# Patient Record
Sex: Female | Born: 1990 | Race: Black or African American | Hispanic: No | Marital: Single | Smoking: Former smoker
Health system: Southern US, Community
[De-identification: ages and names within clinical notes are randomized; demographics above are authoritative.]

## PROBLEM LIST (undated history)

## (undated) ENCOUNTER — Inpatient Hospital Stay (HOSPITAL_COMMUNITY): Payer: Self-pay

## (undated) DIAGNOSIS — N83209 Unspecified ovarian cyst, unspecified side: Secondary | ICD-10-CM

## (undated) DIAGNOSIS — I10 Essential (primary) hypertension: Secondary | ICD-10-CM

## (undated) DIAGNOSIS — Z8719 Personal history of other diseases of the digestive system: Secondary | ICD-10-CM

## (undated) HISTORY — PX: WISDOM TOOTH EXTRACTION: SHX21

## (undated) HISTORY — PX: NO PAST SURGERIES: SHX2092

---
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
## 2017-07-15 LAB — OB RESULTS CONSOLE GC/CHLAMYDIA
CHLAMYDIA, DNA PROBE: NEGATIVE
GC PROBE AMP, GENITAL: NEGATIVE

## 2017-07-15 LAB — OB RESULTS CONSOLE HEPATITIS B SURFACE ANTIGEN: Hepatitis B Surface Ag: NEGATIVE

## 2017-07-15 LAB — OB RESULTS CONSOLE RPR: RPR: NONREACTIVE

## 2017-07-15 LAB — OB RESULTS CONSOLE RUBELLA ANTIBODY, IGM: RUBELLA: IMMUNE

## 2017-07-15 LAB — OB RESULTS CONSOLE HIV ANTIBODY (ROUTINE TESTING): HIV: NONREACTIVE

## 2017-07-16 ENCOUNTER — Other Ambulatory Visit (HOSPITAL_COMMUNITY): Payer: Self-pay | Admitting: Nurse Practitioner

## 2017-07-16 DIAGNOSIS — Z3682 Encounter for antenatal screening for nuchal translucency: Secondary | ICD-10-CM

## 2017-07-16 DIAGNOSIS — Z3A13 13 weeks gestation of pregnancy: Secondary | ICD-10-CM

## 2017-07-24 ENCOUNTER — Encounter (HOSPITAL_COMMUNITY): Payer: Self-pay | Admitting: *Deleted

## 2017-07-25 ENCOUNTER — Encounter (HOSPITAL_COMMUNITY): Payer: Self-pay

## 2017-07-25 ENCOUNTER — Ambulatory Visit (HOSPITAL_COMMUNITY)
Admission: RE | Admit: 2017-07-25 | Discharge: 2017-07-25 | Disposition: A | Discharge status: Home / Self Care | Payer: Medicaid Other | Source: Ambulatory Visit | Attending: Nurse Practitioner | Admitting: Nurse Practitioner

## 2017-07-25 DIAGNOSIS — Z3682 Encounter for antenatal screening for nuchal translucency: Secondary | ICD-10-CM | POA: Insufficient documentation

## 2017-07-25 DIAGNOSIS — Z3A13 13 weeks gestation of pregnancy: Secondary | ICD-10-CM | POA: Insufficient documentation

## 2017-07-25 HISTORY — DX: Unspecified ovarian cyst, unspecified side: N83.209

## 2017-07-25 HISTORY — DX: Personal history of other diseases of the digestive system: Z87.19

## 2017-08-01 ENCOUNTER — Other Ambulatory Visit: Payer: Self-pay

## 2017-08-21 ENCOUNTER — Other Ambulatory Visit: Payer: Self-pay

## 2017-09-24 ENCOUNTER — Encounter (HOSPITAL_COMMUNITY): Payer: Self-pay | Admitting: *Deleted

## 2017-09-24 ENCOUNTER — Inpatient Hospital Stay (HOSPITAL_COMMUNITY)
Admission: AD | Admit: 2017-09-24 | Discharge: 2017-09-24 | Disposition: A | Discharge status: Home / Self Care | Payer: Medicaid Other | Source: Ambulatory Visit | Attending: Obstetrics and Gynecology | Admitting: Obstetrics and Gynecology

## 2017-09-24 DIAGNOSIS — O26899 Other specified pregnancy related conditions, unspecified trimester: Secondary | ICD-10-CM

## 2017-09-24 DIAGNOSIS — Z87898 Personal history of other specified conditions: Secondary | ICD-10-CM | POA: Insufficient documentation

## 2017-09-24 DIAGNOSIS — O26892 Other specified pregnancy related conditions, second trimester: Secondary | ICD-10-CM | POA: Diagnosis not present

## 2017-09-24 DIAGNOSIS — O23592 Infection of other part of genital tract in pregnancy, second trimester: Secondary | ICD-10-CM | POA: Diagnosis not present

## 2017-09-24 DIAGNOSIS — R102 Pelvic and perineal pain: Secondary | ICD-10-CM | POA: Diagnosis not present

## 2017-09-24 DIAGNOSIS — B9689 Other specified bacterial agents as the cause of diseases classified elsewhere: Secondary | ICD-10-CM | POA: Insufficient documentation

## 2017-09-24 DIAGNOSIS — Z3A21 21 weeks gestation of pregnancy: Secondary | ICD-10-CM | POA: Diagnosis not present

## 2017-09-24 DIAGNOSIS — R109 Unspecified abdominal pain: Secondary | ICD-10-CM | POA: Diagnosis not present

## 2017-09-24 DIAGNOSIS — Z8719 Personal history of other diseases of the digestive system: Secondary | ICD-10-CM | POA: Insufficient documentation

## 2017-09-24 DIAGNOSIS — Z87891 Personal history of nicotine dependence: Secondary | ICD-10-CM | POA: Diagnosis not present

## 2017-09-24 DIAGNOSIS — Z3A31 31 weeks gestation of pregnancy: Secondary | ICD-10-CM | POA: Insufficient documentation

## 2017-09-24 DIAGNOSIS — N760 Acute vaginitis: Secondary | ICD-10-CM | POA: Diagnosis not present

## 2017-09-24 LAB — GC/CHLAMYDIA PROBE AMP (~~LOC~~) NOT AT ARMC
Chlamydia: NEGATIVE
Neisseria Gonorrhea: NEGATIVE

## 2017-09-24 LAB — WET PREP, GENITAL
SPERM: NONE SEEN
TRICH WET PREP: NONE SEEN
YEAST WET PREP: NONE SEEN

## 2017-09-24 LAB — URINALYSIS, ROUTINE W REFLEX MICROSCOPIC
Bilirubin Urine: NEGATIVE
GLUCOSE, UA: NEGATIVE mg/dL
Hgb urine dipstick: NEGATIVE
KETONES UR: NEGATIVE mg/dL
Leukocytes, UA: NEGATIVE
NITRITE: NEGATIVE
PROTEIN: NEGATIVE mg/dL
Specific Gravity, Urine: 1.004 — ABNORMAL LOW (ref 1.005–1.030)
pH: 6 (ref 5.0–8.0)

## 2017-09-24 MED ORDER — METRONIDAZOLE 500 MG PO TABS: 500.0000 mg | ORAL_TABLET | Freq: Two times a day (BID) | ORAL | 0 refills | Status: DC

## 2017-09-24 MED ORDER — ACETAMINOPHEN 500 MG PO TABS
1000.0000 mg | ORAL_TABLET | Freq: Four times a day (QID) | ORAL | Status: DC | PRN
Start: 2017-09-24 — End: 2017-09-24
  Administered 2017-09-24: 1000 mg via ORAL
  Filled 2017-09-24: qty 2

## 2017-09-24 NOTE — Discharge Instructions (Signed)
Round Ligament Pain The round ligament is a cord of muscle and tissue that helps to support the uterus. It can become a source of pain during pregnancy if it becomes stretched or twisted as the baby grows. The pain usually begins in the second trimester of pregnancy, and it can come and go until the baby is delivered. It is not a serious problem, and it does not cause harm to the baby. Round ligament pain is usually a short, sharp, and pinching pain, but it can also be a dull, lingering, and aching pain. The pain is felt in the lower side of the abdomen or in the groin. It usually starts deep in the groin and moves up to the outside of the hip area. Pain can occur with:  A sudden change in position.  Rolling over in bed.  Coughing or sneezing.  Physical activity.  Follow these instructions at home: Watch your condition for any changes. Take these steps to help with your pain:  When the pain starts, relax. Then try: ? Sitting down. ? Flexing your knees up to your abdomen. ? Lying on your side with one pillow under your abdomen and another pillow between your legs. ? Sitting in a warm bath for 15-20 minutes or until the pain goes away.  Take over-the-counter and prescription medicines only as told by your health care provider.  Move slowly when you sit and stand.  Avoid long walks if they cause pain.  Stop or lessen your physical activities if they cause pain.  Contact a health care provider if:  Your pain does not go away with treatment.  You feel pain in your back that you did not have before.  Your medicine is not helping. Get help right away if:  You develop a fever or chills.  You develop uterine contractions.  You develop vaginal bleeding.  You develop nausea or vomiting.  You develop diarrhea.  You have pain when you urinate. This information is not intended to replace advice given to you by your health care provider. Make sure you discuss any questions you have  with your health care provider. Document Released: 08/14/2008 Document Revised: 04/12/2016 Document Reviewed: 01/12/2015 Elsevier Interactive Patient Education  2018 [employer]. [employer] of the uterus can occur throughout pregnancy, but they are not always a sign that you are in labor. You may have practice contractions called Braxton Hicks contractions. These false labor contractions are sometimes confused with true labor. What are [place] contractions? Braxton Hicks contractions are tightening movements that occur in the muscles of the uterus before labor. Unlike true labor contractions, these contractions do not result in opening (dilation) and thinning of the cervix. Toward the end of pregnancy (32-34 weeks), Braxton Hicks contractions can happen more often and may become stronger. These contractions are sometimes difficult to tell apart from true labor because they can be very uncomfortable. You should not feel embarrassed if you go to the hospital with false labor. Sometimes, the only way to tell if you are in true labor is for your health care provider to look for changes in the cervix. The health care provider will do a physical exam and may monitor your contractions. If you are not in true labor, the exam should show that your cervix is not dilating and your water has not broken. If there are no prenatal problems or other health problems associated with your pregnancy, it is completely safe for you to be sent home with false  labor. You may continue to have Braxton Hicks contractions until you go into true labor. How can I tell the difference between true labor and false labor?  Differences ? False labor ? Contractions last 30-70 seconds.: Contractions are usually shorter and not as strong as true labor contractions. ? Contractions become very regular.: Contractions are usually irregular. ? Discomfort is usually felt in the top of the uterus, and it  spreads to the lower abdomen and low back.: Contractions are often felt in the front of the lower abdomen and in the groin. ? Contractions do not go away with walking.: Contractions may go away when you walk around or change positions while lying down. ? Contractions usually become more intense and increase in frequency.: Contractions get weaker and are shorter-lasting as time goes on. ? The cervix dilates and gets thinner.: The cervix usually does not dilate or become thin. Follow these instructions at home:  Take over-the-counter and prescription medicines only as told by your health care provider.  Keep up with your usual exercises and follow other instructions from your health care provider.  Eat and drink lightly if you think you are going into labor.  If Braxton Hicks contractions are making you uncomfortable: ? Change your position from lying down or resting to walking, or change from walking to resting. ? Sit and rest in a tub of warm water. ? Drink enough fluid to keep your urine clear or pale yellow. Dehydration may cause these contractions. ? Do slow and deep breathing several times an hour.  Keep all follow-up prenatal visits as told by your health care provider. This is important. Contact a health care provider if:  You have a fever.  You have continuous pain in your abdomen. Get help right away if:  Your contractions become stronger, more regular, and closer together.  You have fluid leaking or gushing from your vagina.  You pass blood-tinged mucus (bloody show).  You have bleeding from your vagina.  You have low back pain that you never had before.  You feel your babys head pushing down and causing pelvic pressure.  Your baby is not moving inside you as much as it used to. Summary  Contractions that occur before labor are called Braxton Hicks contractions, false labor, or practice contractions.  Braxton Hicks contractions are usually shorter, weaker, farther  apart, and less regular than true labor contractions. True labor contractions usually become progressively stronger and regular and they become more frequent.  Manage discomfort from [facility] contractions by changing position, resting in a warm bath, drinking plenty of water, or practicing deep breathing. This information is not intended to replace advice given to you by your health care provider. Make sure you discuss any questions you have with your health care provider. Document Released: 11/05/2005 Document Revised: 09/24/2016 Document Reviewed: 09/24/2016 Elsevier Interactive Patient Education  2017 [employer].

## 2017-09-24 NOTE — MAU Note (Signed)
PT SAYS  ABD CRAMPS  STARTED  AT 10AM-   STOPPED .   RESTART AT 6PM-   FEELS SAME - BUT CONSISTENT .   PNC WITH  HD.   LAST SEX-  2 WEEKS AGO

## 2017-09-24 NOTE — MAU Provider Note (Signed)
History     CSN: [id number]  Arrival date and time: 09/24/17 3825   First Provider Initiated Contact with Patient 09/24/17 0200      Chief Complaint  Patient presents with  . Abdominal Pain   G1 @21 .5 wks here with cramping. Sx started about 8 hrs ago. Also feels sharp pain in lower abdomen at times. No VB or vaginal discharge. No urinary sx. Admits to only 2 bottles of water today.   OB History    Gravida Para Term Preterm AB Living   1             SAB TAB Ectopic Multiple Live Births                  Past Medical History:  Diagnosis Date  . History of stomach ulcers 2011  . Ovarian cyst 2011    Past Surgical History:  Procedure Laterality Date  . NO PAST SURGERIES      History reviewed. No pertinent family history.  Social History   Tobacco Use  . Smoking status: Former Smoker    Packs/day: 0.25    Types: Cigarettes    Last attempt to quit: 06/08/2017    Years since quitting: 0.2  . Smokeless tobacco: Never Used  Substance Use Topics  . Alcohol use: No  . Drug use: No    Allergies: No Known Allergies  Medications Prior to Admission  Medication Sig Dispense Refill Last Dose  . Pediatric Multiple Vit-C-FA (FLINSTONES GUMMIES OMEGA-3 DHA PO) Take by mouth.   09/23/2017 at Unknown time    Review of Systems  Gastrointestinal: Positive for abdominal pain.  Genitourinary: Negative for dysuria, vaginal bleeding and vaginal discharge.   Physical Exam   Blood pressure 104/62, pulse 73, temperature 98.2 F (36.8 C), temperature source Oral, resp. rate 20, height 5\' 2"  (1.575 m), weight 163 lb (73.9 kg), last menstrual period 04/25/2017.  Physical Exam  Constitutional: She is oriented to person, place, and time. She appears well-developed and well-nourished. No distress.  HENT:  Head: Normocephalic and atraumatic.  Neck: Normal range of motion.  Cardiovascular: Normal rate.  Respiratory: Effort normal. No respiratory distress.  GI: Soft. She exhibits no  distension and no mass. There is tenderness (left and right inguinal). There is no rebound and no guarding.  gravid  Genitourinary:  Genitourinary Comments: Cervix closed/thick  Musculoskeletal: Normal range of motion.  Neurological: She is alert and oriented to person, place, and time.  Skin: Skin is warm and dry.  Psychiatric: She has a normal mood and affect.  FHT 153 [place]:   Results for orders placed or performed during the hospital encounter of 09/24/17 (from the past 24 hour(s))  Urinalysis, Routine w reflex microscopic     Status: Abnormal   Collection Time: 09/24/17  1:39 AM  Result Value Ref Range   Color, Urine STRAW (A) YELLOW   APPearance CLEAR CLEAR   Specific Gravity, Urine 1.004 (L) 1.005 - 1.030   pH 6.0 5.0 - 8.0   Glucose, UA NEGATIVE NEGATIVE mg/dL   Hgb urine dipstick NEGATIVE NEGATIVE   Bilirubin Urine NEGATIVE NEGATIVE   Ketones, ur NEGATIVE NEGATIVE mg/dL   Protein, ur NEGATIVE NEGATIVE mg/dL   Nitrite NEGATIVE NEGATIVE   Leukocytes, UA NEGATIVE NEGATIVE  Wet prep, genital     Status: Abnormal   Collection Time: 09/24/17  2:06 AM  Result Value Ref Range   Yeast Wet Prep HPF POC NONE SEEN NONE SEEN   Trich, Wet Prep  NONE SEEN NONE SEEN   Clue Cells Wet Prep HPF POC PRESENT (A) NONE SEEN   WBC, Wet Prep HPF POC FEW (A) NONE SEEN   Sperm NONE SEEN    MAU Course  Procedures Po hydration Tylenol  MDM Labs ordered and reviewed. No evidence of UTI or imminent SAB. Will treat BV. Stable for discharge home.  Assessment and Plan   1. [redacted] weeks gestation of pregnancy   2. Abdominal cramping affecting pregnancy   3. Bacterial vaginosis   4. Pain of round ligament during pregnancy    Discharge home Follow up at [facility] this week as scheduled PTL precautions Rx Flagyl Tub soaks Tylenol prn Increase hydration  Allergies as of 09/24/2017   No Known Allergies     Medication List    TAKE these medications   FLINSTONES GUMMIES OMEGA-3 DHA PO Take by  mouth.   metroNIDAZOLE 500 MG tablet Commonly known as:  FLAGYL Take 1 tablet (500 mg total) 2 (two) times daily by mouth.      Julianne Handler, CNM 09/24/2017, 2:09 AM

## 2017-09-27 ENCOUNTER — Inpatient Hospital Stay (HOSPITAL_COMMUNITY)
Admission: AD | Admit: 2017-09-27 | Discharge: 2017-09-27 | Disposition: A | Discharge status: Home / Self Care | Payer: Medicaid Other | Source: Ambulatory Visit | Attending: Obstetrics & Gynecology | Admitting: Obstetrics & Gynecology

## 2017-09-27 ENCOUNTER — Inpatient Hospital Stay (HOSPITAL_COMMUNITY): Payer: Medicaid Other

## 2017-09-27 ENCOUNTER — Encounter (HOSPITAL_COMMUNITY): Payer: Self-pay | Admitting: *Deleted

## 2017-09-27 DIAGNOSIS — Z8719 Personal history of other diseases of the digestive system: Secondary | ICD-10-CM | POA: Diagnosis not present

## 2017-09-27 DIAGNOSIS — Z87891 Personal history of nicotine dependence: Secondary | ICD-10-CM | POA: Diagnosis not present

## 2017-09-27 DIAGNOSIS — O26892 Other specified pregnancy related conditions, second trimester: Secondary | ICD-10-CM | POA: Insufficient documentation

## 2017-09-27 DIAGNOSIS — Z3A22 22 weeks gestation of pregnancy: Secondary | ICD-10-CM | POA: Insufficient documentation

## 2017-09-27 DIAGNOSIS — R102 Pelvic and perineal pain: Secondary | ICD-10-CM | POA: Diagnosis present

## 2017-09-27 DIAGNOSIS — D259 Leiomyoma of uterus, unspecified: Secondary | ICD-10-CM | POA: Diagnosis not present

## 2017-09-27 DIAGNOSIS — O3412 Maternal care for benign tumor of corpus uteri, second trimester: Secondary | ICD-10-CM | POA: Diagnosis not present

## 2017-09-27 DIAGNOSIS — O3482 Maternal care for other abnormalities of pelvic organs, second trimester: Secondary | ICD-10-CM | POA: Insufficient documentation

## 2017-09-27 DIAGNOSIS — K7689 Other specified diseases of liver: Secondary | ICD-10-CM | POA: Diagnosis not present

## 2017-09-27 LAB — CBC WITH DIFFERENTIAL/PLATELET
BASOS PCT: 0 %
Basophils Absolute: 0 10*3/uL (ref 0.0–0.1)
EOS ABS: 0.1 10*3/uL (ref 0.0–0.7)
Eosinophils Relative: 1 %
HCT: 34.6 % — ABNORMAL LOW (ref 36.0–46.0)
Hemoglobin: 11.8 g/dL — ABNORMAL LOW (ref 12.0–15.0)
LYMPHS ABS: 2.2 10*3/uL (ref 0.7–4.0)
Lymphocytes Relative: 20 %
MCH: 31.4 pg (ref 26.0–34.0)
MCHC: 34.1 g/dL (ref 30.0–36.0)
MCV: 92 fL (ref 78.0–100.0)
MONOS PCT: 4 %
Monocytes Absolute: 0.4 10*3/uL (ref 0.1–1.0)
Neutro Abs: 8.4 10*3/uL — ABNORMAL HIGH (ref 1.7–7.7)
Neutrophils Relative %: 75 %
Platelets: 292 10*3/uL (ref 150–400)
RBC: 3.76 MIL/uL — ABNORMAL LOW (ref 3.87–5.11)
RDW: 13.4 % (ref 11.5–15.5)
WBC: 11 10*3/uL — ABNORMAL HIGH (ref 4.0–10.5)

## 2017-09-27 LAB — URINALYSIS, ROUTINE W REFLEX MICROSCOPIC
BILIRUBIN URINE: NEGATIVE
GLUCOSE, UA: NEGATIVE mg/dL
HGB URINE DIPSTICK: NEGATIVE
KETONES UR: NEGATIVE mg/dL
Nitrite: NEGATIVE
PH: 6 (ref 5.0–8.0)
Protein, ur: NEGATIVE mg/dL
SPECIFIC GRAVITY, URINE: 1.029 (ref 1.005–1.030)

## 2017-09-27 LAB — COMPREHENSIVE METABOLIC PANEL
ALBUMIN: 3.3 g/dL — AB (ref 3.5–5.0)
ALK PHOS: 51 U/L (ref 38–126)
ALT: 17 U/L (ref 14–54)
ANION GAP: 9 (ref 5–15)
AST: 19 U/L (ref 15–41)
BILIRUBIN TOTAL: 0.5 mg/dL (ref 0.3–1.2)
BUN: 7 mg/dL (ref 6–20)
CALCIUM: 8.9 mg/dL (ref 8.9–10.3)
CO2: 20 mmol/L — AB (ref 22–32)
Chloride: 105 mmol/L (ref 101–111)
Creatinine, Ser: 0.56 mg/dL (ref 0.44–1.00)
GFR calc Af Amer: >60 mL/min (ref >60)
GFR calc non Af Amer: >60 mL/min (ref >60)
GLUCOSE: 80 mg/dL (ref 65–99)
Potassium: 3.8 mmol/L (ref 3.5–5.1)
SODIUM: 134 mmol/L — AB (ref 135–145)
TOTAL PROTEIN: 7.4 g/dL (ref 6.5–8.1)

## 2017-09-27 MED ORDER — KETOROLAC TROMETHAMINE 30 MG/ML IJ SOLN
30.0000 mg | Freq: Once | INTRAMUSCULAR | Status: AC
  Administered 2017-09-27: 30 mg via INTRAVENOUS
  Filled 2017-09-27: qty 1

## 2017-09-27 MED ORDER — PROMETHAZINE HCL 25 MG PO TABS: 25.0000 mg | ORAL_TABLET | Freq: Four times a day (QID) | ORAL | 0 refills | Status: DC | PRN

## 2017-09-27 MED ORDER — PROMETHAZINE HCL 25 MG/ML IJ SOLN
25.0000 mg | Freq: Once | INTRAMUSCULAR | Status: AC
  Administered 2017-09-27: 25 mg via INTRAVENOUS
  Filled 2017-09-27: qty 1

## 2017-09-27 MED ORDER — KETOROLAC TROMETHAMINE 10 MG PO TABS: 10.0000 mg | ORAL_TABLET | Freq: Four times a day (QID) | ORAL | 0 refills | Status: DC | PRN

## 2017-09-27 MED ORDER — IOPAMIDOL (ISOVUE-300) INJECTION 61%
100.0000 mL | Freq: Once | INTRAVENOUS | Status: AC | PRN
  Administered 2017-09-27: 100 mL via INTRAVENOUS

## 2017-09-27 MED ORDER — OXYCODONE-ACETAMINOPHEN 5-325 MG PO TABS: 2.0000 | ORAL_TABLET | Freq: Four times a day (QID) | ORAL | 0 refills | Status: DC | PRN

## 2017-09-27 MED ORDER — IOPAMIDOL (ISOVUE-300) INJECTION 61%
30.0000 mL | Freq: Once | INTRAVENOUS | Status: AC | PRN
  Administered 2017-09-27: 30 mL via ORAL

## 2017-09-27 MED ORDER — HYDROMORPHONE HCL 1 MG/ML IJ SOLN
1.0000 mg | Freq: Once | INTRAMUSCULAR | Status: AC
  Administered 2017-09-27: 1 mg via INTRAVENOUS
  Filled 2017-09-27: qty 1

## 2017-09-27 MED ORDER — ONDANSETRON HCL 40 MG/20ML IJ SOLN
8.0000 mg | Freq: Once | INTRAMUSCULAR | Status: AC
  Administered 2017-09-27: 8 mg via INTRAVENOUS
  Filled 2017-09-27: qty 4

## 2017-09-27 MED ORDER — PROMETHAZINE HCL 25 MG/ML IJ SOLN: 25.0000 mg | Freq: Once | INTRAMUSCULAR | Status: DC

## 2017-09-27 MED ORDER — SODIUM CHLORIDE 0.9 % IV SOLN
INTRAVENOUS | Status: DC
  Administered 2017-09-27: 13:00:00 via INTRAVENOUS

## 2017-09-27 NOTE — MAU Provider Note (Signed)
History  I confirm that I have verified the information documented in the medical student's note and that I have also personally reperformed the physical exam and all medical decision making activities.  Laury Deep, CNM 09/27/2017 9:03 PM    CSN: [id number]  Arrival date & time 09/27/17  1142   First Provider Initiated Contact with Patient 09/27/17 1224      Chief Complaint  Patient presents with  . Pelvic Pain    Mrs. Fincher is a 26 yo G1P0 at [redacted]w[redacted]d who presents with worsening RLQ pain. Pt reports the pain started Monday and was seen at the MAU on Tuesday. She was diagnosed with round ligament pain and advised to take tylenol. Pt states the tylenol has not help with the pain. The pain has been worse since this morning at her prenatal appt, rating it a 10/10 on the pain scale. Pt describes athe pain as a constant achy pain with intermittent stabbing pain that last anywhere from 30 sec to 1 min. She has had some associated N/V, but denies fever, headache, vaginal bleeding, SOB, CP, dysuria, back pain, or vaginal discharge.     Past Medical History:  Diagnosis Date  . History of stomach ulcers 2011  . Ovarian cyst 2011    Past Surgical History:  Procedure Laterality Date  . WISDOM TOOTH EXTRACTION      History reviewed. No pertinent family history.  Social History   Tobacco Use  . Smoking status: Former Smoker    Packs/day: 0.25    Types: Cigarettes    Last attempt to quit: 06/08/2017    Years since quitting: 0.3  . Smokeless tobacco: Never Used  Substance Use Topics  . Alcohol use: No  . Drug use: No    OB History    Gravida Para Term Preterm AB Living   1             SAB TAB Ectopic Multiple Live Births                  Review of Systems  Constitutional: Positive for activity change and appetite change. Negative for chills and fever.  HENT: Negative for congestion.   Eyes: Negative for visual disturbance.  Respiratory: Negative for chest tightness,  shortness of breath and wheezing.   Cardiovascular: Negative for chest pain and palpitations.  Gastrointestinal: Positive for abdominal pain, nausea and vomiting. Negative for abdominal distention, constipation and diarrhea.  Genitourinary: Negative for dysuria, hematuria, vaginal bleeding and vaginal discharge.  Musculoskeletal: Negative for back pain and myalgias.  Skin: Negative for color change and rash.  Neurological: Negative for dizziness, weakness and headaches.  Psychiatric/Behavioral: Negative for agitation and confusion.    Allergies  Patient has no known allergies.  Home Medications    BP (!) 105/55 (BP Location: Left Arm)   Pulse 68   Temp (!) 97.3 F (36.3 C) (Oral)   Resp 18   Ht 5\' 2"  (1.575 m)   Wt 162 lb (73.5 kg)   LMP 04/25/2017   SpO2 99%   BMI 29.63 kg/m   Physical Exam  Constitutional: She is oriented to person, place, and time. She appears well-developed and well-nourished. No distress.  HENT:  Head: Normocephalic and atraumatic.  Eyes: Conjunctivae are normal. No scleral icterus.  Neck: Normal range of motion. Neck supple.  Cardiovascular: Normal rate, regular rhythm, normal heart sounds and intact distal pulses. Exam reveals no gallop and no friction rub.  No murmur heard. Pulmonary/Chest: Effort normal and breath  sounds normal. No respiratory distress. She has no wheezes.  Abdominal: Soft. Bowel sounds are normal. She exhibits no distension and no mass. There is tenderness. There is rebound and guarding.  Severe pain on palpation of RLQ with same level of pain on rebound   Musculoskeletal: Normal range of motion.  Neurological: She is alert and oriented to person, place, and time.  Skin: Skin is warm and dry. Rash noted.  Psychiatric: She has a normal mood and affect.    MAU Course  Procedures (including critical care time) Results for orders placed or performed during the hospital encounter of 09/27/17 (from the past 24 hour(s))  Urinalysis,  Routine w reflex microscopic     Status: Abnormal   Collection Time: 09/27/17 11:55 AM  Result Value Ref Range   Color, Urine AMBER (A) YELLOW   APPearance HAZY (A) CLEAR   Specific Gravity, Urine 1.029 1.005 - 1.030   pH 6.0 5.0 - 8.0   Glucose, UA NEGATIVE NEGATIVE mg/dL   Hgb urine dipstick NEGATIVE NEGATIVE   Bilirubin Urine NEGATIVE NEGATIVE   Ketones, ur NEGATIVE NEGATIVE mg/dL   Protein, ur NEGATIVE NEGATIVE mg/dL   Nitrite NEGATIVE NEGATIVE   Leukocytes, UA TRACE (A) NEGATIVE   RBC / HPF 0-5 0 - 5 RBC/hpf   WBC, UA 0-5 0 - 5 WBC/hpf   Bacteria, UA RARE (A) NONE SEEN   Squamous Epithelial / LPF 6-30 (A) NONE SEEN   Mucus PRESENT   CBC with Differential/Platelet     Status: Abnormal   Collection Time: 09/27/17 12:45 PM  Result Value Ref Range   WBC 11.0 (H) 4.0 - 10.5 K/uL   RBC 3.76 (L) 3.87 - 5.11 MIL/uL   Hemoglobin 11.8 (L) 12.0 - 15.0 g/dL   HCT 34.6 (L) 36.0 - 46.0 %   MCV 92.0 78.0 - 100.0 fL   MCH 31.4 26.0 - 34.0 pg   MCHC 34.1 30.0 - 36.0 g/dL   RDW 13.4 11.5 - 15.5 %   Platelets 292 150 - 400 K/uL   Neutrophils Relative % 75 %   Neutro Abs 8.4 (H) 1.7 - 7.7 K/uL   Lymphocytes Relative 20 %   Lymphs Abs 2.2 0.7 - 4.0 K/uL   Monocytes Relative 4 %   Monocytes Absolute 0.4 0.1 - 1.0 K/uL   Eosinophils Relative 1 %   Eosinophils Absolute 0.1 0.0 - 0.7 K/uL   Basophils Relative 0 %   Basophils Absolute 0.0 0.0 - 0.1 K/uL  Comprehensive metabolic panel     Status: Abnormal   Collection Time: 09/27/17 12:45 PM  Result Value Ref Range   Sodium 134 (L) 135 - 145 mmol/L   Potassium 3.8 3.5 - 5.1 mmol/L   Chloride 105 101 - 111 mmol/L   CO2 20 (L) 22 - 32 mmol/L   Glucose, Bld 80 65 - 99 mg/dL   BUN 7 6 - 20 mg/dL   Creatinine, Ser 0.56 0.44 - 1.00 mg/dL   Calcium 8.9 8.9 - 10.3 mg/dL   Total Protein 7.4 6.5 - 8.1 g/dL   Albumin 3.3 (L) 3.5 - 5.0 g/dL   AST 19 15 - 41 U/L   ALT 17 14 - 54 U/L   Alkaline Phosphatase 51 38 - 126 U/L   Total Bilirubin 0.5  0.3 - 1.2 mg/dL   GFR calc non Af Amer >60 >60 mL/min   GFR calc Af Amer >60 >60 mL/min   Anion gap 9 5 - 15  Labs Reviewed  URINALYSIS, ROUTINE W REFLEX MICROSCOPIC - Abnormal; Notable for the following components:      Result Value   Color, Urine AMBER (*)    APPearance HAZY (*)    Leukocytes, UA TRACE (*)    Bacteria, UA RARE (*)    Squamous Epithelial / LPF 6-30 (*)    All other components within normal limits  CBC WITH DIFFERENTIAL/PLATELET - Abnormal; Notable for the following components:   WBC 11.0 (*)    RBC 3.76 (*)    Hemoglobin 11.8 (*)    HCT 34.6 (*)    Neutro Abs 8.4 (*)    All other components within normal limits  COMPREHENSIVE METABOLIC PANEL - Abnormal; Notable for the following components:   Sodium 134 (*)    CO2 20 (*)    Albumin 3.3 (*)    All other components within normal limits   US Pelvis Limited  Result Date: 09/27/2017 CLINICAL DATA:  Suspected uterine fibroids on recently performed CT. EXAM: LIMITED ULTRASOUND OF PELVIS TECHNIQUE: Limited transabdominal ultrasound examination of the pelvis was performed. COMPARISON:  CT of the abdomen and pelvis 09/27/2017 FINDINGS: Limited grayscale and Doppler ultrasound of the pelvis demonstrate a soft tissue mass superior to the right adnexa and lateral to the umbilicus which measures 4.5 x 5.4 x 4.6 cm. Scant internal vascularity is noted. Small amount of fluid is seen adjacent to it. Partially visualize gravid uterus. IMPRESSION: 5.4 cm soft tissue mass superior to the right adnexa. As mentioned on prior CT this may represent an exophytic subserosal fibroid, however its relation to the uterus is difficult to determine on this limited ultrasound. Electronically Signed   By: Fidela Salisbury M.D.   On: 09/27/2017 18:16   Ct Abdomen Pelvis W Contrast  Result Date: 09/27/2017 CLINICAL DATA:  [redacted] weeks pregnant with severe RLQ pain for 4 days. Elevated WBC. Evaluate for appendicitis. Consent form signed. EXAM: CT  ABDOMEN AND PELVIS WITH CONTRAST TECHNIQUE: Multidetector CT imaging of the abdomen and pelvis was performed using the standard protocol following bolus administration of intravenous contrast. CONTRAST:  46mL ISOVUE-300 IOPAMIDOL (ISOVUE-300) INJECTION 61%, 15mL ISOVUE-300 IOPAMIDOL (ISOVUE-300) INJECTION 61% COMPARISON:  Ultrasound 07/25/2017. FINDINGS: Lower chest: No acute abnormality. Hepatobiliary: A small hypervascular lesion is identified within the right hepatic lobe, statistically likely a benign hemangioma measuring 11 mm. Differential diagnosis includes FNH or adenoma. Gallbladder is present. Pancreas: Unremarkable. No pancreatic ductal dilatation or surrounding inflammatory changes. Spleen: Normal in size without focal abnormality. Adrenals/Urinary Tract: Adrenal glands are unremarkable. Kidneys are normal, without renal calculi, focal lesion, or hydronephrosis. Bladder is unremarkable. Stomach/Bowel: The stomach and small bowel loops are normal in appearance. No evidence for bowel obstruction. There is significant stool throughout the colon. The appendix is not identified. Vascular/Lymphatic: No significant vascular findings are present. No enlarged abdominal or pelvic lymph nodes. Reproductive: Gravid uterus. Fetus in cephalic presentation. Fundal placenta. Along the right lateral uterine wall, there is a soft tissue density mass measuring 5.0 x 4.2 cm and compatible with the uterine fibroid. ( by previous ultrasound, fibroid measured 4.5 x 4.3 x 3.7 cm.) A similar rounded mass is identified at the fundus, measuring 3.3 x 3.0 cm. The ovaries are normal in appearance. Other: There is right lower quadrant fluid, contiguous with the ovary, uterus, and right lateral fibroid. Low density fluid, adjacent to the right lateral uterine fibroid. Musculoskeletal: No acute or significant osseous findings. IMPRESSION: 1. Right lateral uterine fibroid measuring 5.0 cm. There is fluid surrounding  this fibroid and  the findings may indicate degenerating fibroid as a source of the patient's pain. Consider ultrasound correlation for this finding. 2. Although the appendix is not seen, no inflammatory changes are identified in the region of the cecum to indicate acute appendicitis. 3. Gravid uterus.  Cephalic presentation. 4. Statistically benign small hypervascular lesion in the right hepatic lobe, likely representing hemangioma. 5. No hydronephrosis or evidence for urinary tract abnormality. 6. Large stool burden. These results were called by telephone at the time of interpretation on 09/27/2017 at 4:28 pm to Dr. Laury Deep , who verbally acknowledged these results. Electronically Signed   By: Nolon Nations M.D.   On: 09/27/2017 16:28     MDM  Pt presents with worseing RLQ pain. Recently seen in MAU on Tuesday.  Tylenol currently not controlling pain.  CBC shows elevated WBC with 75% neutrophils, UA negative for UTI.  Cervix closed on exam Consult with Dr. Harolyn Rutherford and she agreed with getting an abdominal/pelvis CT. We also consulted Dr. Lovey Newcomer, Radiologist, who recommended a CT scan with contrast. Pt not allergic to contrast.  Plan to give pt something for pain and manage based on results of CT scan.  TC from Dr. Nolon Nations who gave verbal report of CT scan, she recommends  *Consult with Dr. Elonda Husky @ [phone] - notified of patient's complaints, assessments, lab, CT & U/S results, recommended tx plan d/c home with Rx for Percocet, Toradol (only Disp #6) & Phenergan 25 mg - ok to d/c home, agrees with plan    Assessment/Plan:  Uterine fibroid complicating antenatal care, baby not yet delivered in second trimester Pelvic pain affecting pregnancy in second trimester, antepartum - Rx for Percocet, Toradol (only Disp #6) & Phenergan 25 mg  - Advised to return to MAU with worsening condition - Keep scheduled appt with [facility]  Discharge home Patient verbalized an understanding of the plan of care and  agrees.    Linwood Dibbles Medical Student 09/27/2017 12:56 PM

## 2017-09-27 NOTE — MAU Note (Signed)
Pt presents with complaint of right lower abd/pelvic pain. Reports she was seen 2 days ago dx with round ligament pain but states the pain worsened this am and is now severe. Denies bleeding.

## 2017-09-27 NOTE — MAU Note (Signed)
Pt exquisitely tender on R side abdomen during doppler FHR check.

## 2017-09-27 NOTE — MAU Note (Signed)
Pt vomited large amount, phenergan given.  Pt to CT by WC.

## 2017-11-19 NOTE — L&D Delivery Note (Addendum)
Patient is a 27 y.o. now G1P1001 s/p NSVD at [redacted]w[redacted]d, who was admitted for IOL for cholestasis.  She progressed with augmentation to complete.  Cord clamping delayed by several minutes then clamped by me with supervision and cut by father of the baby.  Placenta intact and spontaneous, bleeding minimal.  Few labial abrasions noted but no lacerations that required repair. She plans on using condoms for birth control.  Delivery Note At 3:33 AM a viable female was delivered via Vaginal, Spontaneous (Presentation: OA) in usual fashion.  APGAR: 9, 9; weight pending.   Placenta status: intact.  Cord: 3 vessel with no complications. Cord blood sent.  Anesthesia: Epidural Episiotomy: None Lacerations: None Suture Repair: None Est. Blood Loss (mL):  269ml  Mom to postpartum.  Baby to Couplet care / Skin to Skin.  Rory Percy, DO 01/11/18, 4:04 AM

## 2017-12-31 ENCOUNTER — Encounter (HOSPITAL_COMMUNITY): Payer: Self-pay | Admitting: *Deleted

## 2017-12-31 ENCOUNTER — Other Ambulatory Visit: Payer: Self-pay

## 2017-12-31 ENCOUNTER — Inpatient Hospital Stay (HOSPITAL_COMMUNITY)
Admission: AD | Admit: 2017-12-31 | Discharge: 2017-12-31 | Disposition: A | Discharge status: Home / Self Care | Payer: Medicaid Other | Source: Ambulatory Visit | Attending: Obstetrics and Gynecology | Admitting: Obstetrics and Gynecology

## 2017-12-31 DIAGNOSIS — Z3A35 35 weeks gestation of pregnancy: Secondary | ICD-10-CM | POA: Diagnosis not present

## 2017-12-31 DIAGNOSIS — O26892 Other specified pregnancy related conditions, second trimester: Secondary | ICD-10-CM

## 2017-12-31 DIAGNOSIS — R102 Pelvic and perineal pain: Secondary | ICD-10-CM | POA: Insufficient documentation

## 2017-12-31 DIAGNOSIS — L298 Other pruritus: Secondary | ICD-10-CM | POA: Diagnosis not present

## 2017-12-31 DIAGNOSIS — O2686 Pruritic urticarial papules and plaques of pregnancy (PUPPP): Secondary | ICD-10-CM | POA: Diagnosis not present

## 2017-12-31 DIAGNOSIS — Z87891 Personal history of nicotine dependence: Secondary | ICD-10-CM | POA: Diagnosis not present

## 2017-12-31 DIAGNOSIS — R921 Mammographic calcification found on diagnostic imaging of breast: Secondary | ICD-10-CM | POA: Diagnosis present

## 2017-12-31 DIAGNOSIS — R238 Other skin changes: Secondary | ICD-10-CM | POA: Insufficient documentation

## 2017-12-31 DIAGNOSIS — O26893 Other specified pregnancy related conditions, third trimester: Secondary | ICD-10-CM | POA: Insufficient documentation

## 2017-12-31 LAB — URINALYSIS, ROUTINE W REFLEX MICROSCOPIC
Bilirubin Urine: NEGATIVE
Glucose, UA: NEGATIVE mg/dL
Hgb urine dipstick: NEGATIVE
Ketones, ur: NEGATIVE mg/dL
Leukocytes, UA: NEGATIVE
Nitrite: NEGATIVE
Protein, ur: NEGATIVE mg/dL
Specific Gravity, Urine: 1.016 (ref 1.005–1.030)
pH: 6 (ref 5.0–8.0)

## 2017-12-31 MED ORDER — TRIAMCINOLONE ACETONIDE 0.1 % EX CREA: 1.0000 "application " | TOPICAL_CREAM | Freq: Two times a day (BID) | CUTANEOUS | 1 refills | Status: DC

## 2017-12-31 NOTE — MAU Provider Note (Signed)
Chief Complaint:  Rash   First Provider Initiated Contact with Patient 12/31/17 [phone]      HPI: Janet Pollard is a 27 y.o. G1P0 at 88w5dwho presents to maternity admissions reporting itching and rash. She reports that the rash started on Saturday and it was present on her stomach, over the past couple of days it has gotten worse and spread to other areas of her body to her legs, arms, chest and now back. She has tried OTC anti-itch cream with no relief from itching. She denies concerns with pregnancy, she denies abdominal pain or cramping.  She reports good fetal movement, denies LOF, vaginal bleeding, vaginal itching/burning, urinary symptoms, h/a, dizziness, n/v, or fever/chills.    Past Medical History: Past Medical History:  Diagnosis Date  . History of stomach ulcers 2011  . Ovarian cyst 2011    Past obstetric history: OB History  Gravida Para Term Preterm AB Living  1            SAB TAB Ectopic Multiple Live Births               # Outcome Date GA Lbr Len/2nd Weight Sex Delivery Anes PTL Lv  1 Current               Past Surgical History: Past Surgical History:  Procedure Laterality Date  . WISDOM TOOTH EXTRACTION      Family History: Family History  Problem Relation Age of Onset  . Diabetes Father     Social History: Social History   Tobacco Use  . Smoking status: Former Smoker    Packs/day: 0.25    Types: Cigarettes    Last attempt to quit: 06/08/2017    Years since quitting: 0.5  . Smokeless tobacco: Never Used  Substance Use Topics  . Alcohol use: No  . Drug use: No    Allergies: No Known Allergies  Meds:  Medications Prior to Admission  Medication Sig Dispense Refill Last Dose  . OVER THE COUNTER MEDICATION Take 2 tablets by mouth daily.   12/30/2017 at Unknown time  . ketorolac (TORADOL) 10 MG tablet Take 1 tablet (10 mg total) every 6 (six) hours as needed by mouth for moderate pain. (Patient not taking: Reported on 12/31/2017) 6 tablet 0 Not Taking  at Unknown time  . oxyCODONE-acetaminophen (ROXICET) 5-325 MG tablet Take 2 tablets every 6 (six) hours as needed by mouth for severe pain. (Patient not taking: Reported on 12/31/2017) 20 tablet 0 Not Taking at Unknown time  . promethazine (PHENERGAN) 25 MG tablet Take 1 tablet (25 mg total) every 6 (six) hours as needed by mouth for nausea or vomiting. (Patient not taking: Reported on 12/31/2017) 30 tablet 0 Not Taking at Unknown time    ROS:  Review of Systems  Constitutional: Negative.   Respiratory: Negative.   Cardiovascular: Negative.   Gastrointestinal: Negative.   Genitourinary: Negative.   Musculoskeletal: Negative.   Skin: Positive for rash.  Neurological: Negative.   Psychiatric/Behavioral: Negative.    I have reviewed patient's Past Medical Hx, Surgical Hx, Family Hx, Social Hx, medications and allergies.   Physical Exam   Patient Vitals for the past 24 hrs:  BP Temp Temp src Pulse Resp SpO2 Height Weight  12/31/17 1045 120/70 98.6 F (37 C) Oral 81 18 99 % - -  12/31/17 0848 113/65 98.6 F (37 C) Oral 95 20 100 % - -  12/31/17 0834 - - - - - - 5\' 2"  (1.575 m) 183 lb (  83 kg)   Constitutional: Well-developed, well-nourished female in no acute distress.  Cardiovascular: normal rate Respiratory: normal effort GI: Abd soft, non-tender, gravid appropriate for gestational age. Pruritic papules noted within striae surrounding umbilicus.   MS: Extremities nontender, no edema, normal ROM Neurologic: Alert and oriented x 4.  GU: Neg CVAT. PELVIC EXAM: deferred  Skin: erythematous pruitic papules noted on abdomen, bilateral inner thighs, bilateral inner arms. erythematous plaques noted on chest around left breast. None noted on soles of hands and feet.    FHT:  Baseline 150 , moderate variability, accelerations present, no decelerations Contractions: q 4-6 mins/ mild- patient states she does not feel contractions or cramping    Labs: Results for orders placed or performed  during the hospital encounter of 12/31/17 (from the past 24 hour(s))  Urinalysis, Routine w reflex microscopic     Status: Abnormal   Collection Time: 12/31/17  8:33 AM  Result Value Ref Range   Color, Urine YELLOW YELLOW   APPearance CLOUDY (A) CLEAR   Specific Gravity, Urine 1.016 1.005 - 1.030   pH 6.0 5.0 - 8.0   Glucose, UA NEGATIVE NEGATIVE mg/dL   Hgb urine dipstick NEGATIVE NEGATIVE   Bilirubin Urine NEGATIVE NEGATIVE   Ketones, ur NEGATIVE NEGATIVE mg/dL   Protein, ur NEGATIVE NEGATIVE mg/dL   Nitrite NEGATIVE NEGATIVE   Leukocytes, UA NEGATIVE NEGATIVE    MAU Course/MDM: Orders Placed This Encounter  Procedures  . Urinalysis, Routine w reflex microscopic  . Discharge patient Discharge disposition: 01-Home or Self Care; Discharge patient date: 12/31/2017    Meds ordered this encounter  Medications  . triamcinolone cream (KENALOG) 0.1 %    Sig: Apply 1 application topically 2 (two) times daily.    Dispense:  30 g    Refill:  1    Order Specific Question:   Supervising Provider    Answer:   CONSTANT, PEGGY [[id number]]    NST reviewed Pt drove herself to hospital- unable to given Benadryl for itching Pt discharge with instructions to start Kenalog cream today and follow up as scheduled at the HD. Educated on PEP and PUPPP during pregnancy. Educated on this being self limiting and usually resolving in 4-6 weeks or within 2 weeks PP. Patient agrees to [place].   Assessment: 1. PUPPP (pruritic urticarial papules and plaques of pregnancy)   2. Pelvic pain affecting pregnancy in second trimester, antepartum     Plan: Discharge home Preterm Labor precautions and fetal kick counts Rx for Kenalog 0.1% sent to pharmacy   Follow-up Information    Department, [facility] Follow up.   Why:  Follow up as scheduled and return to MAU as needed  Contact information: [place] [address] [phone]           Allergies as of 12/31/2017   No  Known Allergies     Medication List    STOP taking these medications   ketorolac 10 MG tablet Commonly known as:  TORADOL   oxyCODONE-acetaminophen 5-325 MG tablet Commonly known as:  ROXICET     TAKE these medications   OVER THE COUNTER MEDICATION Take 2 tablets by mouth daily.   promethazine 25 MG tablet Commonly known as:  PHENERGAN Take 1 tablet (25 mg total) every 6 (six) hours as needed by mouth for nausea or vomiting.   triamcinolone cream 0.1 % Commonly known as:  KENALOG Apply 1 application topically 2 (two) times daily.       Darrol Poke  Certified Nurse-Midwife 12/31/2017 10:32 AM

## 2017-12-31 NOTE — MAU Note (Signed)
Pt presents with c/o itchy skin rash that began Saturday.  Reports rash is on arms, between inner thighs, lower back, abdomen, and breast.  Denies changing detergents or coming in contact with any weeds. Denies VB or LOF.  Reports +FM.

## 2018-01-08 ENCOUNTER — Other Ambulatory Visit: Payer: Self-pay | Admitting: Advanced Practice Midwife

## 2018-01-09 ENCOUNTER — Inpatient Hospital Stay (HOSPITAL_COMMUNITY)
Admission: RE | Admit: 2018-01-09 | Discharge: 2018-01-13 | DRG: 805 | Disposition: A | Discharge status: Home / Self Care | Payer: Medicaid Other | Source: Ambulatory Visit | Attending: Obstetrics and Gynecology | Admitting: Obstetrics and Gynecology

## 2018-01-09 ENCOUNTER — Other Ambulatory Visit: Payer: Self-pay

## 2018-01-09 ENCOUNTER — Encounter (HOSPITAL_COMMUNITY): Payer: Self-pay

## 2018-01-09 VITALS — BP 103/57 | HR 76 | Temp 98.4°F | Resp 16 | Ht 62.0 in | Wt 183.2 lb

## 2018-01-09 DIAGNOSIS — D259 Leiomyoma of uterus, unspecified: Secondary | ICD-10-CM | POA: Diagnosis present

## 2018-01-09 DIAGNOSIS — O2662 Liver and biliary tract disorders in childbirth: Principal | ICD-10-CM | POA: Diagnosis present

## 2018-01-09 DIAGNOSIS — S9491XA Injury of unspecified nerve at ankle and foot level, right leg, initial encounter: Secondary | ICD-10-CM | POA: Diagnosis present

## 2018-01-09 DIAGNOSIS — Z23 Encounter for immunization: Secondary | ICD-10-CM

## 2018-01-09 DIAGNOSIS — O9089 Other complications of the puerperium, not elsewhere classified: Secondary | ICD-10-CM | POA: Diagnosis present

## 2018-01-09 DIAGNOSIS — O3413 Maternal care for benign tumor of corpus uteri, third trimester: Secondary | ICD-10-CM | POA: Diagnosis present

## 2018-01-09 DIAGNOSIS — O26892 Other specified pregnancy related conditions, second trimester: Secondary | ICD-10-CM

## 2018-01-09 DIAGNOSIS — O26613 Liver and biliary tract disorders in pregnancy, third trimester: Secondary | ICD-10-CM

## 2018-01-09 DIAGNOSIS — Z87891 Personal history of nicotine dependence: Secondary | ICD-10-CM

## 2018-01-09 DIAGNOSIS — Z3A37 37 weeks gestation of pregnancy: Secondary | ICD-10-CM | POA: Diagnosis not present

## 2018-01-09 DIAGNOSIS — K831 Obstruction of bile duct: Secondary | ICD-10-CM | POA: Diagnosis present

## 2018-01-09 DIAGNOSIS — R102 Pelvic and perineal pain: Secondary | ICD-10-CM

## 2018-01-09 LAB — RPR: RPR Ser Ql: NONREACTIVE

## 2018-01-09 LAB — CBC
HEMATOCRIT: 32.2 % — AB (ref 36.0–46.0)
Hemoglobin: 11 g/dL — ABNORMAL LOW (ref 12.0–15.0)
MCH: 30.6 pg (ref 26.0–34.0)
MCHC: 34.2 g/dL (ref 30.0–36.0)
MCV: 89.7 fL (ref 78.0–100.0)
Platelets: 284 10*3/uL (ref 150–400)
RBC: 3.59 MIL/uL — ABNORMAL LOW (ref 3.87–5.11)
RDW: 14 % (ref 11.5–15.5)
WBC: 12.8 10*3/uL — AB (ref 4.0–10.5)

## 2018-01-09 LAB — TYPE AND SCREEN
ABO/RH(D): B POS
Antibody Screen: NEGATIVE

## 2018-01-09 LAB — ABO/RH: ABO/RH(D): B POS

## 2018-01-09 MED ORDER — LACTATED RINGERS IV SOLN
INTRAVENOUS | Status: DC
  Administered 2018-01-09 – 2018-01-10 (×5): via INTRAVENOUS
  Administered 2018-01-11: 500 mL via INTRAVENOUS

## 2018-01-09 MED ORDER — OXYTOCIN BOLUS FROM INFUSION
500.0000 mL | Freq: Once | INTRAVENOUS | Status: AC
  Administered 2018-01-11: 500 mL via INTRAVENOUS

## 2018-01-09 MED ORDER — ACETAMINOPHEN 325 MG PO TABS: 650.0000 mg | ORAL_TABLET | ORAL | Status: DC | PRN

## 2018-01-09 MED ORDER — LACTATED RINGERS IV SOLN
500.0000 mL | INTRAVENOUS | Status: DC | PRN
  Administered 2018-01-09: 300 mL via INTRAVENOUS

## 2018-01-09 MED ORDER — PHENYLEPHRINE 40 MCG/ML (10ML) SYRINGE FOR IV PUSH (FOR BLOOD PRESSURE SUPPORT): 80.0000 ug | PREFILLED_SYRINGE | INTRAVENOUS | Status: DC | PRN

## 2018-01-09 MED ORDER — LIDOCAINE HCL (PF) 1 % IJ SOLN: 30.0000 mL | INTRAMUSCULAR | Status: DC | PRN

## 2018-01-09 MED ORDER — TERBUTALINE SULFATE 1 MG/ML IJ SOLN
0.2500 mg | Freq: Once | INTRAMUSCULAR | Status: DC | PRN
  Filled 2018-01-09: qty 1

## 2018-01-09 MED ORDER — LACTATED RINGERS IV SOLN
500.0000 mL | Freq: Once | INTRAVENOUS | Status: AC
  Administered 2018-01-09: 500 mL via INTRAVENOUS

## 2018-01-09 MED ORDER — ONDANSETRON HCL 4 MG/2ML IJ SOLN
4.0000 mg | Freq: Four times a day (QID) | INTRAMUSCULAR | Status: DC | PRN
  Administered 2018-01-10: 4 mg via INTRAVENOUS
  Filled 2018-01-09: qty 2

## 2018-01-09 MED ORDER — EPHEDRINE 5 MG/ML INJ: 10.0000 mg | INTRAVENOUS | Status: DC | PRN

## 2018-01-09 MED ORDER — SOD CITRATE-CITRIC ACID 500-334 MG/5ML PO SOLN: 30.0000 mL | ORAL | Status: DC | PRN

## 2018-01-09 MED ORDER — OXYCODONE-ACETAMINOPHEN 5-325 MG PO TABS: 1.0000 | ORAL_TABLET | ORAL | Status: DC | PRN

## 2018-01-09 MED ORDER — FENTANYL 2.5 MCG/ML BUPIVACAINE 1/10 % EPIDURAL INFUSION (WH - ANES)
INTRAMUSCULAR | Status: AC
  Filled 2018-01-09: qty 100

## 2018-01-09 MED ORDER — FENTANYL CITRATE (PF) 100 MCG/2ML IJ SOLN
100.0000 ug | INTRAMUSCULAR | Status: DC | PRN
  Administered 2018-01-09 (×4): 100 ug via INTRAVENOUS
  Filled 2018-01-09 (×4): qty 2

## 2018-01-09 MED ORDER — PHENYLEPHRINE 40 MCG/ML (10ML) SYRINGE FOR IV PUSH (FOR BLOOD PRESSURE SUPPORT)
PREFILLED_SYRINGE | INTRAVENOUS | Status: AC
  Filled 2018-01-09: qty 20

## 2018-01-09 MED ORDER — MISOPROSTOL 50MCG HALF TABLET
50.0000 ug | ORAL_TABLET | ORAL | Status: DC | PRN
  Administered 2018-01-09 (×2): 50 ug via BUCCAL
  Filled 2018-01-09 (×2): qty 1

## 2018-01-09 MED ORDER — DIPHENHYDRAMINE HCL 50 MG/ML IJ SOLN
12.5000 mg | INTRAMUSCULAR | Status: DC | PRN
  Administered 2018-01-09 – 2018-01-10 (×2): 12.5 mg via INTRAVENOUS
  Filled 2018-01-09 (×2): qty 1

## 2018-01-09 MED ORDER — OXYTOCIN 40 UNITS IN LACTATED RINGERS INFUSION - SIMPLE MED
1.0000 m[IU]/min | INTRAVENOUS | Status: DC
  Administered 2018-01-09: 2 m[IU]/min via INTRAVENOUS

## 2018-01-09 MED ORDER — TERBUTALINE SULFATE 1 MG/ML IJ SOLN
0.2500 mg | Freq: Once | INTRAMUSCULAR | Status: AC | PRN
  Administered 2018-01-10: 0.25 mg via SUBCUTANEOUS

## 2018-01-09 MED ORDER — FENTANYL 2.5 MCG/ML BUPIVACAINE 1/10 % EPIDURAL INFUSION (WH - ANES)
14.0000 mL/h | INTRAMUSCULAR | Status: DC | PRN
  Administered 2018-01-10 (×4): 14 mL/h via EPIDURAL
  Filled 2018-01-09 (×3): qty 100

## 2018-01-09 MED ORDER — OXYCODONE-ACETAMINOPHEN 5-325 MG PO TABS: 2.0000 | ORAL_TABLET | ORAL | Status: DC | PRN

## 2018-01-09 MED ORDER — OXYTOCIN 40 UNITS IN LACTATED RINGERS INFUSION - SIMPLE MED
2.5000 [IU]/h | INTRAVENOUS | Status: DC
Start: 2018-01-09 — End: 2018-01-11
  Administered 2018-01-11: 2.5 [IU]/h via INTRAVENOUS
  Filled 2018-01-09: qty 1000

## 2018-01-09 NOTE — Progress Notes (Signed)
Foley was pulled out around 1800 and pt's cx reported to be 3/60/-2/  Pitocin was started at 1815 at 2 mu/min, and ctx have been 1-2 min utes apart ever since.  Cx check at 2315 was 2.5/70/-2.  Foley reinserted and inflated w/60cc H20, pitocin dc'd (was only at 2 mu/min).  Will restart when foley comes out. RN aware not to pull too hard on the foley so that pt will get full benefit of the cervical ripening.  FHR Cat 1.  Vitals:   01/09/18 2301 01/09/18 2316  BP: 113/66   Pulse: 79   Resp:  20  Temp:  97.7 F (36.5 C)  SpO2:

## 2018-01-09 NOTE — Progress Notes (Signed)
Labor Progress Note Janet Pollard is a 27 y.o. G1P0 at [redacted]w[redacted]d presented for IOL for cholestasis  S:  Patient comfortable, reports feeling intermittent contractions  O:  BP 116/66 (BP Location: Right Arm)   Pulse 78   Temp 98.8 F (37.1 C) (Oral)   Resp 18   Ht 5\' 2"  (1.575 m)   Wt 184 lb (83.5 kg)   LMP 04/25/2017   SpO2 99%   BMI 33.65 kg/m   Fetal Tracing:  Baseline: 140 Variability: moderate Accels: 15x15 Decels: none  Toco: 1-4   CVE: Dilation: 1 Effacement (%): 50 Cervical Position: Middle Station: -2, -3 Presentation: Vertex Exam by:: Lynnda Shields RN   A&P: 27 y.o. G1P0 [redacted]w[redacted]d IOL cholestasis #Labor: continue cytotec, will place foley when able #Pain: n/a #FWB: Cat 1 #GBS pending  Wende Mott, CNM 3:00 PM

## 2018-01-09 NOTE — Progress Notes (Signed)
Patient contracting too often for more cytotec.   Cervix: Dilation: 1 Effacement (%): 60 Cervical Position: Middle Station: -2 Presentation: Vertex Exam by:: Lynnda Shields RN  Discussed with patient risks and benefits of placement of foley balloon for induction. Patient agreeable to plan of care. Foley placed without difficulty and inflated with 96ml. Patient tolerated procedure well.   Will continue cytotec when able and plan pitocin after foley falls out.   Wende Mott, CNM 01/09/18 4:26 PM

## 2018-01-09 NOTE — H&P (Signed)
OBSTETRIC ADMISSION HISTORY AND PHYSICAL  Janet Pollard is a 27 y.o. female G1P0 with IUP at [redacted]w[redacted]d by LMP presenting for IOL for cholestasis. She reports +FMs, No LOF, no VB, no blurry vision, headaches or peripheral edema, and RUQ pain.  She plans on breast feeding. She is unsure if she wants anything for birth control, used natural family planning before and is considering using that again after this pregnancy.   Faxed prenatal records reviewed- bile acids resulted at 12. Patient reports minimal itching today.  GBS still pending per [facility]  She received her prenatal care at [facility]: By LMp --->  Estimated Date of Delivery: 01/30/18  Prenatal History/Complications:  Past Medical History: Past Medical History:  Diagnosis Date  . History of stomach ulcers 2011  . Ovarian cyst 2011    Past Surgical History: Past Surgical History:  Procedure Laterality Date  . WISDOM TOOTH EXTRACTION      Obstetrical History: OB History    Gravida Para Term Preterm AB Living   1             SAB TAB Ectopic Multiple Live Births                  Social History: Social History   Socioeconomic History  . Marital status: Single    Spouse name: None  . Number of children: None  . Years of education: None  . Highest education level: None  Social Needs  . Financial resource strain: None  . Food insecurity - worry: None  . Food insecurity - inability: None  . Transportation needs - medical: None  . Transportation needs - non-medical: None  Occupational History  . None  Tobacco Use  . Smoking status: Former Smoker    Packs/day: 0.25    Types: Cigarettes    Last attempt to quit: 06/08/2017    Years since quitting: 0.5  . Smokeless tobacco: Never Used  Substance and Sexual Activity  . Alcohol use: No  . Drug use: No  . Sexual activity: Not Currently    Birth control/protection: None  Other Topics Concern  . None  Social History Narrative  . None    Family History: Family  History  Problem Relation Age of Onset  . Diabetes Father     Allergies: No Known Allergies  Medications Prior to Admission  Medication Sig Dispense Refill Last Dose  . diphenhydrAMINE (BENADRYL CHILDRENS ALLERGY) 12.5 MG/5ML liquid Take 12.5 mg by mouth 2 (two) times daily as needed for itching.   Past Week at Unknown time  . Prenatal MV & Min w/FA-DHA (PRENATAL ADULT GUMMY/DHA/FA) 0.4-25 MG CHEW Chew 2 tablets by mouth daily.   01/08/2018 at Unknown time  . promethazine (PHENERGAN) 25 MG tablet Take 1 tablet (25 mg total) every 6 (six) hours as needed by mouth for nausea or vomiting. (Patient not taking: Reported on 12/31/2017) 30 tablet 0 Not Taking at Unknown time  . triamcinolone cream (KENALOG) 0.1 % Apply 1 application topically 2 (two) times daily. 30 g 1    Review of Systems   All systems reviewed and negative except as stated in HPI  Blood pressure 109/75, pulse 83, temperature 99.2 F (37.3 C), temperature source Oral, resp. rate 16, height 5\' 2"  (1.575 m), weight 184 lb (83.5 kg), last menstrual period 04/25/2017, SpO2 99 %. General appearance: alert, cooperative and no distress Lungs: clear to auscultation bilaterally Heart: regular rate and rhythm Abdomen: soft, non-tender; bowel sounds normal Pelvic: n/a  Extremities: Homans sign is negative, no sign of DVT DTR's +2 Presentation: cephalic Fetal monitoringBaseline: 150 bpm, Variability: Good {> 6 bpm), Accelerations: Reactive and Decelerations: Absent Uterine activityNone Dilation: 1 Effacement (%): 50, 60 Station: -3, -2 Exam by:: Lynnda Shields RN   Prenatal labs: ABO, Rh: --/--/B POS (02/21 4801) Antibody: NEG (02/21 0718) Rubella:  Immune RPR:   negative HBsAg:   negative HIV:   non reactive  GBS:   pending  1 hr Glucola- normal, 106 Genetic screening- negative Anatomy US - normal  Prenatal Transfer Tool  Maternal Diabetes: No Genetic Screening: Normal Maternal Ultrasounds/Referrals: Normal Fetal  Ultrasounds or other Referrals:  None Maternal Substance Abuse:  No Significant Maternal Medications:  None Significant Maternal Lab Results: None  Results for orders placed or performed during the hospital encounter of 01/09/18 (from the past 24 hour(s))  CBC   Collection Time: 01/09/18  7:18 AM  Result Value Ref Range   WBC 12.8 (H) 4.0 - 10.5 K/uL   RBC 3.59 (L) 3.87 - 5.11 MIL/uL   Hemoglobin 11.0 (L) 12.0 - 15.0 g/dL   HCT 32.2 (L) 36.0 - 46.0 %   MCV 89.7 78.0 - 100.0 fL   MCH 30.6 26.0 - 34.0 pg   MCHC 34.2 30.0 - 36.0 g/dL   RDW 14.0 11.5 - 15.5 %   Platelets 284 150 - 400 K/uL  Type and screen   Collection Time: 01/09/18  7:18 AM  Result Value Ref Range   ABO/RH(D) B POS    Antibody Screen NEG    Sample Expiration      01/12/2018 Performed at [facility], [address], [place], [place] [address]     Patient Active Problem List   Diagnosis Date Noted  . Cholestasis during pregnancy in third trimester 01/09/2018  . Uterine fibroid complicating antenatal care, baby not yet delivered in second trimester 09/27/2017  . Pelvic pain affecting pregnancy in second trimester, antepartum 09/27/2017    Assessment/Plan:  Janet Pollard is a 27 y.o. G1P0 at [redacted]w[redacted]d here for IOL for cholestasis  #Labor: Cytotec for cervical ripening, plan foley bulb when able #Pain: Considering epidural when needed #FWB: Cat 1 #ID:  GBS pending, will recollect culture today #MOF: Breast #MOC: NFP, considering other options #Circ:  [place], CNM  01/09/2018, 9:31 AM

## 2018-01-09 NOTE — Anesthesia Pain Management Evaluation Note (Signed)
  CRNA Pain Management Visit Note  Patient: Janet Pollard, 27 y.o., female  "Hello I am a member of the anesthesia team at [facility]. We have an anesthesia team available at all times to provide care throughout the hospital, including epidural management and anesthesia for C-section. I don't know your plan for the delivery whether it a natural birth, water birth, IV sedation, nitrous supplementation, doula or epidural, but we want to meet your pain goals."   1.Was your pain managed to your expectations on prior hospitalizations?   No prior hospitalizations  2.What is your expectation for pain management during this hospitalization?     Epidural  3.How can we help you reach that goal? Epidural when ready  Record the patient's initial score and the patient's pain goal.   Pain: 1  Pain Goal: 7 The [facility] wants you to be able to say your pain was always managed very well.  Everette Rank 01/09/2018

## 2018-01-10 ENCOUNTER — Inpatient Hospital Stay (HOSPITAL_COMMUNITY): Payer: Medicaid Other | Admitting: Anesthesiology

## 2018-01-10 LAB — CBC
HEMATOCRIT: 32.4 % — AB (ref 36.0–46.0)
Hemoglobin: 10.9 g/dL — ABNORMAL LOW (ref 12.0–15.0)
MCH: 30.6 pg (ref 26.0–34.0)
MCHC: 33.6 g/dL (ref 30.0–36.0)
MCV: 91 fL (ref 78.0–100.0)
Platelets: 280 10*3/uL (ref 150–400)
RBC: 3.56 MIL/uL — AB (ref 3.87–5.11)
RDW: 14.3 % (ref 11.5–15.5)
WBC: 15.2 10*3/uL — ABNORMAL HIGH (ref 4.0–10.5)

## 2018-01-10 LAB — COMPREHENSIVE METABOLIC PANEL
ALT: 19 U/L (ref 14–54)
ANION GAP: 10 (ref 5–15)
AST: 25 U/L (ref 15–41)
Albumin: 2.9 g/dL — ABNORMAL LOW (ref 3.5–5.0)
Alkaline Phosphatase: 85 U/L (ref 38–126)
BILIRUBIN TOTAL: 1.1 mg/dL (ref 0.3–1.2)
BUN: 5 mg/dL — ABNORMAL LOW (ref 6–20)
CHLORIDE: 105 mmol/L (ref 101–111)
CO2: 20 mmol/L — ABNORMAL LOW (ref 22–32)
Calcium: 8.8 mg/dL — ABNORMAL LOW (ref 8.9–10.3)
Creatinine, Ser: 0.67 mg/dL (ref 0.44–1.00)
Glucose, Bld: 84 mg/dL (ref 65–99)
POTASSIUM: 3.6 mmol/L (ref 3.5–5.1)
Sodium: 135 mmol/L (ref 135–145)
TOTAL PROTEIN: 6.9 g/dL (ref 6.5–8.1)

## 2018-01-10 LAB — PROTEIN / CREATININE RATIO, URINE
Creatinine, Urine: 92 mg/dL
Protein Creatinine Ratio: 0.1 mg/mg{Cre} (ref 0.00–0.15)
TOTAL PROTEIN, URINE: 9 mg/dL

## 2018-01-10 MED ORDER — LIDOCAINE HCL (PF) 1 % IJ SOLN
INTRAMUSCULAR | Status: DC | PRN
  Administered 2018-01-10 (×2): 5 mL via EPIDURAL

## 2018-01-10 MED ORDER — BUTALBITAL-APAP-CAFFEINE 50-325-40 MG PO TABS
1.0000 | ORAL_TABLET | Freq: Once | ORAL | Status: AC
  Administered 2018-01-10: 1 via ORAL
  Filled 2018-01-10: qty 1

## 2018-01-10 MED ORDER — OXYTOCIN 40 UNITS IN LACTATED RINGERS INFUSION - SIMPLE MED
1.0000 m[IU]/min | INTRAVENOUS | Status: DC
  Administered 2018-01-10: 1 m[IU]/min via INTRAVENOUS
  Administered 2018-01-10: 2 m[IU]/min via INTRAVENOUS
  Filled 2018-01-10: qty 1000

## 2018-01-10 NOTE — Progress Notes (Signed)
   Donicia Druck is a 27 y.o. G1P0 at [redacted]w[redacted]d  admitted for induction of labor due to cholestasis.  Subjective: Comfortable with epidural.  Foley fell out at 0115.    Objective: Vitals:   01/10/18 0101 01/10/18 0131 01/10/18 0145 01/10/18 0201  BP: 117/82 130/83  121/79  Pulse: 76 87  97  Resp:   16   Temp:      TempSrc:      SpO2:      Weight:      Height:       No intake/output data recorded.  FHT:  FHR: 145 bpm, variability: minimal now moderate,  accelerations:  Abscent,  decelerations:  Absent UC:   Became tachysytole w/very little resting tone.  Not on Pitocin, terbutaline 0.25mg  sq given.  Now ctx are 1.5-2.5 minutes apart SVE:   Dilation: 3.5 Effacement (%): 70 Station: -2 Exam by:: Merri Brunette, RN   Labs: Lab Results  Component Value Date   WBC 12.8 (H) 01/09/2018   HGB 11.0 (L) 01/09/2018   HCT 32.2 (L) 01/09/2018   MCV 89.7 01/09/2018   PLT 284 01/09/2018    Assessment / Plan: IOL for cholestasis, ripening phase complete.  Tachysystolic ctx w/reduced variability/accels, resolved now  Labor: wil augment w/pitocin if ctx slow down Fetal Wellbeing:  Category I and Category II Pain Control:  Epidural Anticipated MOD:  NSVD  Christin Fudge 01/10/2018, 2:25 AM

## 2018-01-10 NOTE — Anesthesia Preprocedure Evaluation (Signed)

## 2018-01-10 NOTE — Progress Notes (Signed)
Janet Pollard is a 27 y.o. G1P0 at [redacted]w[redacted]d  admitted for induction of labor due to cholestasis.  Subjective: Comfortable with epidural. Itching is moderately controlled. Having some thigh edema.  Objective: Vitals:   01/10/18 1331 01/10/18 1401 01/10/18 1430 01/10/18 1431  BP: (!) 95/52 (!) 143/79 124/84   Pulse: 70 91 87   Resp:    20  Temp:    98.4 F (36.9 C)  TempSrc:    Oral  SpO2:      Weight:      Height:       Total I/O In: -  Out: 1600 [Urine:1600]  FHT:  FHR: 145 bpm, variability: minimal, moderate,  accelerations:  Absent,  decelerations:  Absent UC:  Regular, 1-4 SVE:   Dilation: 5.5 Effacement (%): 90 Station: -2 Exam by:: sowder  Pitocin @5mu /min  Labs: Lab Results  Component Value Date   WBC 15.2 (H) 01/10/2018   HGB 10.9 (L) 01/10/2018   HCT 32.4 (L) 01/10/2018   MCV 91.0 01/10/2018   PLT 280 01/10/2018    Assessment / Plan: IOL for cholestasis, ripening phase complete.   Early labor  Labor: Pitocin for augmentation. Continue to titrate. AROM'd and IUPC placed due to what appears to be tachysytole  Fetal Wellbeing:  Category II Pain Control:  Epidural Anticipated MOD:  NSVD  Luiz Blare, DO 01/10/2018, 2:53 PM

## 2018-01-10 NOTE — Progress Notes (Signed)
Vitals:   01/10/18 0626 01/10/18 0631  BP:  114/69  Pulse:  73  Resp: 18   Temp: 98.9 F (37.2 C)   SpO2:    Still itching terribly on arms and stomach d/t cholestasis/PUPPS.  Has had multiple doses of benadryl that "don't do a thing"  Will try cold wet towels.  cx 4/70/-2.  Ctx now spaced out to 2-3 minutes.  FHR Cat 1.  Will restart pitocin.

## 2018-01-10 NOTE — Progress Notes (Signed)
Labor Progress Note Janet Pollard is a 27 y.o. G1P0 at [redacted]w[redacted]d presented for here for IOL for cholestasis.  S: Patient resting comfortably with epidural.  O:  BP (!) 106/91   Pulse 93   Temp 98.4 F (36.9 C) (Oral)   Resp 20   Ht 5\' 2"  (1.575 m)   Wt 184 lb (83.5 kg)   LMP 04/25/2017   SpO2 98%   BMI 33.65 kg/m   LMR:AJHHIDUP rate 145, moderate variability, + acels, no decels Toco: ctx every 2-3 min  CVE: Dilation: 5.5 Effacement (%): 90 Cervical Position: Middle Station: -2 Presentation: Vertex Exam by:: phelps  A&P: 27 y.o. G1P0 [redacted]w[redacted]d here for IOL for cholestasis. #Labor: Progressing well. Continue Pitocin and titrate as needed. IUPC in place, contractions now adequate. #Pain: Epidural #FWB: Cat I #GBS pending, will start PCN as indicated once resulted  Rory Percy, DO 9:59 PM

## 2018-01-10 NOTE — Anesthesia Procedure Notes (Signed)
Epidural Patient location during procedure: OB Start time: 01/10/2018 12:00 AM End time: 01/10/2018 12:06 AM  Staffing Anesthesiologist: Janeece Riggers, MD  Preanesthetic Checklist Completed: patient identified, site marked, surgical consent, pre-op evaluation, timeout performed, IV checked, risks and benefits discussed and monitors and equipment checked  Epidural Patient position: sitting Prep: site prepped and draped and DuraPrep Patient monitoring: continuous pulse ox and blood pressure Approach: midline Location: L3-L4 Injection technique: LOR air  Needle:  Needle type: Tuohy  Needle gauge: 17 G Needle length: 9 cm and 9 Needle insertion depth: 6 cm Catheter type: closed end flexible Catheter size: 19 Gauge Catheter at skin depth: 11 cm Test dose: negative  Assessment Events: blood not aspirated, injection not painful, no injection resistance, negative IV test and no paresthesia

## 2018-01-11 ENCOUNTER — Encounter (HOSPITAL_COMMUNITY): Payer: Self-pay

## 2018-01-11 DIAGNOSIS — Z3A37 37 weeks gestation of pregnancy: Secondary | ICD-10-CM

## 2018-01-11 DIAGNOSIS — K831 Obstruction of bile duct: Secondary | ICD-10-CM

## 2018-01-11 DIAGNOSIS — O2662 Liver and biliary tract disorders in childbirth: Secondary | ICD-10-CM

## 2018-01-11 LAB — CULTURE, BETA STREP (GROUP B ONLY)

## 2018-01-11 LAB — RAPID STREP SCREEN (MED CTR MEBANE ONLY): STREPTOCOCCUS, GROUP A SCREEN (DIRECT): NEGATIVE

## 2018-01-11 MED ORDER — TETANUS-DIPHTH-ACELL PERTUSSIS 5-2.5-18.5 LF-MCG/0.5 IM SUSP: 0.5000 mL | Freq: Once | INTRAMUSCULAR | Status: DC

## 2018-01-11 MED ORDER — BENZOCAINE-MENTHOL 20-0.5 % EX AERO
1.0000 "application " | INHALATION_SPRAY | CUTANEOUS | Status: DC | PRN
  Administered 2018-01-11: 1 via TOPICAL
  Filled 2018-01-11 (×2): qty 56

## 2018-01-11 MED ORDER — IBUPROFEN 600 MG PO TABS
600.0000 mg | ORAL_TABLET | Freq: Four times a day (QID) | ORAL | Status: DC
  Administered 2018-01-11 – 2018-01-13 (×9): 600 mg via ORAL
  Filled 2018-01-11 (×10): qty 1

## 2018-01-11 MED ORDER — DIBUCAINE 1 % RE OINT
1.0000 "application " | TOPICAL_OINTMENT | RECTAL | Status: DC | PRN
  Filled 2018-01-11: qty 28

## 2018-01-11 MED ORDER — WITCH HAZEL-GLYCERIN EX PADS: 1.0000 "application " | MEDICATED_PAD | CUTANEOUS | Status: DC | PRN

## 2018-01-11 MED ORDER — COCONUT OIL OIL
1.0000 "application " | TOPICAL_OIL | Status: DC | PRN
  Administered 2018-01-12: 1 via TOPICAL
  Filled 2018-01-11 (×2): qty 120

## 2018-01-11 MED ORDER — SENNOSIDES-DOCUSATE SODIUM 8.6-50 MG PO TABS
2.0000 | ORAL_TABLET | ORAL | Status: DC
  Administered 2018-01-11 – 2018-01-12 (×2): 2 via ORAL
  Filled 2018-01-11 (×2): qty 2

## 2018-01-11 MED ORDER — ZOLPIDEM TARTRATE 5 MG PO TABS: 5.0000 mg | ORAL_TABLET | Freq: Every evening | ORAL | Status: DC | PRN

## 2018-01-11 MED ORDER — PNEUMOCOCCAL VAC POLYVALENT 25 MCG/0.5ML IJ INJ
0.5000 mL | INJECTION | INTRAMUSCULAR | Status: AC
Start: 2018-01-12 — End: 2018-01-12
  Administered 2018-01-12: 0.5 mL via INTRAMUSCULAR
  Filled 2018-01-11 (×2): qty 0.5

## 2018-01-11 MED ORDER — DIPHENHYDRAMINE HCL 25 MG PO CAPS: 25.0000 mg | ORAL_CAPSULE | Freq: Four times a day (QID) | ORAL | Status: DC | PRN

## 2018-01-11 MED ORDER — PRENATAL MULTIVITAMIN CH
1.0000 | ORAL_TABLET | Freq: Every day | ORAL | Status: DC
  Administered 2018-01-11 – 2018-01-12 (×2): 1 via ORAL
  Filled 2018-01-11 (×2): qty 1

## 2018-01-11 MED ORDER — ONDANSETRON HCL 4 MG/2ML IJ SOLN: 4.0000 mg | INTRAMUSCULAR | Status: DC | PRN

## 2018-01-11 MED ORDER — ACETAMINOPHEN 325 MG PO TABS
650.0000 mg | ORAL_TABLET | ORAL | Status: DC | PRN
  Administered 2018-01-11: 650 mg via ORAL
  Filled 2018-01-11: qty 2

## 2018-01-11 MED ORDER — ONDANSETRON HCL 4 MG PO TABS: 4.0000 mg | ORAL_TABLET | ORAL | Status: DC | PRN

## 2018-01-11 MED ORDER — SIMETHICONE 80 MG PO CHEW
80.0000 mg | CHEWABLE_TABLET | ORAL | Status: DC | PRN
  Filled 2018-01-11: qty 1

## 2018-01-11 NOTE — Progress Notes (Signed)
Janet Pollard is a 27 y.o. G1P0 at [redacted]w[redacted]d  admitted for induction of labor due to cholestasis.  Subjective: Comfortable with epidural.  Objective: Vitals:   01/10/18 2201 01/10/18 2231 01/10/18 2301 01/10/18 2331  BP: (!) 105/53 122/74 106/63 (!) 98/54  Pulse: 81   92  Resp: 20 20 20 20   Temp:    99.6 F (37.6 C)  TempSrc:    Oral  SpO2:      Weight:      Height:       Total I/O In: -  Out: 725 [Urine:725]  FHT:  FHR: 135 bpm, variability: moderate, accelerations: Present,  decelerations:  Absent UC:  Regular, 2-4 SVE:   Dilation: Lip/rim Effacement (%): 100 Station: +1 Exam by:: Dr. Gerarda Fraction  Pitocin @8mu /min  Labs: Lab Results  Component Value Date   WBC 15.2 (H) 01/10/2018   HGB 10.9 (L) 01/10/2018   HCT 32.4 (L) 01/10/2018   MCV 91.0 01/10/2018   PLT 280 01/10/2018    Assessment / Plan: IOL for cholestasis, ripening phase complete.   Active labor, progressing normally with pitocin  Labor: Only a rim, continue pitocin at current dose. Will allow time for baby to descend.  Fetal Wellbeing:  Category I Pain Control:  Epidural Anticipated MOD:  NSVD  Luiz Blare, DO 01/11/2018, 1:12 AM

## 2018-01-11 NOTE — Anesthesia Postprocedure Evaluation (Signed)
Anesthesia Post Note  Patient: Janet Pollard  Procedure(s) Performed: AN AD HOC LABOR EPIDURAL     Anesthesia Type: Epidural Level of consciousness: awake and alert Pain management: pain level controlled Vital Signs Assessment: post-procedure vital signs reviewed and stable Respiratory status: spontaneous breathing Cardiovascular status: stable Postop Assessment: no headache, patient able to bend at knees, no backache, no apparent nausea or vomiting, epidural receding and adequate PO intake Anesthetic complications: no    Last Vitals:  Vitals:   01/11/18 0618 01/11/18 0743  BP: 106/86 107/62  Pulse: 83 90  Resp: 18 16  Temp: 37.2 C 37.2 C  SpO2:  100%    Last Pain:  Vitals:   01/11/18 0743  TempSrc: Axillary  PainSc: 2    Pain Goal: Patients Stated Pain Goal: 2 (01/11/18 0743)               Ailene Ards

## 2018-01-11 NOTE — Lactation Note (Signed)
This note was copied from a baby's chart. Lactation Consultation Note  Patient Name: Girl Revia Nghiem KPTWS'F Date: 01/11/2018 Reason for consult: Initial assessment;Early term 37-38.6wks;[facility] Breastfeeding consultation services and Caring For Your Late Preterm Baby handout given and reviewed.  Discussed with mom that baby may be sleepy at breast.  Pumping with a DEBP recommended for stimulation.  Baby is 8 hours old and has latched twice.  Latch scores 6.  Symphony pump set up and initiated.  Instructed to latch baby with any cues, post pump every 2-3 hours and give any expressed milk to baby.  Encouraged to call out for latch assist prn.  Maternal Data Has patient been taught Hand Expression?: Yes Does the patient have breastfeeding experience prior to this delivery?: No  Feeding Length of feed: 15 min  LATCH Score Latch: Repeated attempts needed to sustain latch, nipple held in mouth throughout feeding, stimulation needed to elicit sucking reflex.  Audible Swallowing: A few with stimulation  Type of Nipple: Flat  Comfort (Breast/Nipple): Soft / non-tender  Hold (Positioning): Assistance needed to correctly position infant at breast and maintain latch.  LATCH Score: 6  Interventions    Lactation Tools Discussed/Used Pump Review: Setup, frequency, and cleaning;Milk Storage Initiated by:: LM Date initiated:: 01/11/18   Consult Status Consult Status: Follow-up Date: 01/12/18 Follow-up type: In-patient    Ave Filter 01/11/2018, 12:26 PM

## 2018-01-12 NOTE — Addendum Note (Signed)
Addendum  created 01/12/18 1220 by Barnet Glasgow, MD   Sign clinical note

## 2018-01-12 NOTE — Progress Notes (Signed)
Post partum day # 1  Subjective:  Pt Sitting at bedside eating lunch. Has been up to rest room two times without assistance. Pt has R foot tingling not now.  Objective: Wt Readings from Last 3 Encounters:  01/12/18 183 lb 3 oz (83.1 kg)  12/31/17 183 lb (83 kg)  09/27/17 162 lb (73.5 kg)   Temp Readings from Last 3 Encounters:  01/12/18 36.8 C (Oral)  12/31/17 37 C (Oral)  09/27/17 (!) 36.3 C (Oral)   BP Readings from Last 3 Encounters:  01/12/18 112/60  12/31/17 120/70  09/27/17 (!) 105/55   Pulse Readings from Last 3 Encounters:  01/12/18 79  12/31/17 81  09/27/17 68   Physical Exam: Patient with bilateral pedal edema R>L Motor strength R foot 5/5 extension, 5/5 flexation and able to lift R leg to gravity .  Assessment/Plan: Pt with a r foot neuropraxia seems to be resolving. Pt with adequate motor strength. Pt reassured and probable secondary to fetal presentation during labor.  Should continue to resolve over the next few days to weeks.

## 2018-01-12 NOTE — Lactation Note (Signed)
This note was copied from a baby's chart. Lactation Consultation Note Baby 57 hrs old. Cluster feeding. Mom has large pendulous breast. Nipple semi flat. Everts w/stimulation. Breast filling. Hand expressed 17 ml colostrum.  RN reported baby clicking noises when BF, unable to stop. When LC assessed baby in sitting position, no clicking heard. Mom denied pain. Assessed breast. LC hand expressed 10 ml to Lt. Breast. Occasionally noted clicking. Adjusted lips and more support to breast, clicking stopped.  When baby came off of breast, noted nipple in lipstick position. Breast hasn't softened as much as hand expressed breast. Mom stated little difference in BF transfer. Encouraged to assess before and after BF. Demonstrated breast massage. Mom had #20 NS. Applied , baby latched well after a few minutes. Baby tongue thrusting. NS helpful. Spoon fed colostrum. Difficult d/t tongue thrusting. W/gloved finger attempted to assess suck, baby tongue thrust and bit.  NS helpful, mom did c/o of pinching, adjusted lips. Stated comfortable.  Shells given to mom, encouraged to wear bra for support and shells. Encouraged to use hand pump pre-pump to evert nipples.  D/t breast filling, encouraged to post-pump and breast massage to empty breast. Mom states understanding, reported to Rn.  Patient Name: Janet Pollard [id number] Date: 01/12/2018 Reason for consult: Follow-up assessment   Maternal Data    Feeding Feeding Type: Breast Milk Length of feed: 60 min  LATCH Score Latch: Grasps breast easily, tongue down, lips flanged, rhythmical sucking.  Audible Swallowing: Spontaneous and intermittent  Type of Nipple: Flat  Comfort (Breast/Nipple): Filling, red/small blisters or bruises, mild/mod discomfort  Hold (Positioning): Full assist, staff holds infant at breast  LATCH Score: 6  Interventions Interventions: Breast feeding basics reviewed;Assisted with latch;Breast compression;Shells;Skin to  skin;Adjust position;Breast massage;Support pillows;Hand pump;Hand express;Position options;DEBP;Pre-pump if needed;Expressed milk  Lactation Tools Discussed/Used Tools: Shells;Pump;Nipple Shields Nipple shield size: 20 Shell Type: Inverted Breast pump type: Double-Electric Breast Pump;Manual   Consult Status Consult Status: Follow-up Date: 01/13/18 Follow-up type: In-patient    Theodoro Kalata 01/12/2018, 5:21 AM

## 2018-01-12 NOTE — Progress Notes (Signed)
Post Partum Day 1 Subjective: no complaints, voiding, tolerating PO and Still has R foot weakness when up ambulatory. Anesthia consulted  Objective: Blood pressure 112/60, pulse 79, temperature 98.3 F (36.8 C), temperature source Oral, resp. rate 18, height 5\' 2"  (1.575 m), weight 183 lb 3 oz (83.1 kg), last menstrual period 04/25/2017, SpO2 100 %, unknown if currently breastfeeding.  Physical Exam:  General: alert, cooperative, appears stated age and no distress Lochia: appropriate Uterine Fundus: firm Incision: n/a DVT Evaluation: No evidence of DVT seen on physical exam.  Recent Labs    01/10/18 1140  HGB 10.9*  HCT 32.4*    Assessment/Plan: Plan for discharge tomorrow   LOS: 3 days   Koren Shiver 01/12/2018, 8:39 AM

## 2018-01-12 NOTE — Anesthesia Postprocedure Evaluation (Signed)
Anesthesia Post Note  Patient: Chyrel Taha  Procedure(s) Performed: AN AD HOC LABOR EPIDURAL     Anesthesia Post Evaluation  Last Vitals:  Vitals:   01/11/18 2018 01/12/18 0521  BP: 107/64 112/60  Pulse: 74 79  Resp: 18 18  Temp: 36.7 C 36.8 C  SpO2:      Last Pain:  Vitals:   01/12/18 0522  TempSrc:   PainSc: 0-No pain   Pain Goal: Patients Stated Pain Goal: 2 (01/11/18 1713)     pt seen regarding right foot, states unable to bare weight for extended time, States notices tingling. + pedal pluses, equal strength in lower legs. MDA aware. Pt to be reevaluated in 8 hrs          Annamaria Salah, [employer]

## 2018-01-12 NOTE — Addendum Note (Signed)
Addendum  created 01/12/18 0659 by Genevie Ann, CRNA   Sign clinical note

## 2018-01-13 LAB — CULTURE, GROUP A STREP (THRC)

## 2018-01-13 NOTE — Lactation Note (Signed)
This note was copied from a baby's chart. Lactation Consultation Note  Patient Name: Girl Ernesta Trabert [id number] Date: 01/13/2018 Reason for consult: Follow-up assessment;Nipple pain/trauma;Difficult latch;Infant < 6lbs;Early term 37-38.6wks   Follow up with mom of 63 hour old infant. Infant with    Maternal Data Formula Feeding for Exclusion: No Has patient been taught Hand Expression?: Yes Does the patient have breastfeeding experience prior to this delivery?: No  Feeding Feeding Type: Breast Fed Length of feed: 20 min  LATCH Score Latch: Grasps breast easily, tongue down, lips flanged, rhythmical sucking.  Audible Swallowing: Spontaneous and intermittent  Type of Nipple: Everted at rest and after stimulation  Comfort (Breast/Nipple): Filling, red/small blisters or bruises, mild/mod discomfort  Hold (Positioning): Assistance needed to correctly position infant at breast and maintain latch.  LATCH Score: 8  Interventions Interventions: Breast feeding basics reviewed;Support pillows;Assisted with latch;Position options;Skin to skin;Expressed milk;Breast massage;Hand express;Breast compression;Reverse pressure;Pre-pump if needed;Adjust position;DEBP;Hand pump;Shells;Coconut oil  Lactation Tools Discussed/Used Tools: Shells;Pump;Coconut oil;Nipple Shields Nipple shield size: 24 Shell Type: Inverted Breast pump type: Double-Electric Breast Pump;Manual WIC Program: Yes Pump Review: Setup, frequency, and cleaning;Milk Storage Initiated by:: Reviewed and encouraged 4-6 x a day post BF   Consult Status Consult Status: Follow-up Follow-up type: Out-patient    Debby Freiberg Celicia Minahan 01/13/2018, 9:35 AM

## 2018-01-13 NOTE — Lactation Note (Signed)
This note was copied from a baby's chart. Lactation Consultation Note  Patient Name: Janet Pollard [id number] Date: 01/13/2018 Reason for consult: Follow-up assessment;Nipple pain/trauma;Difficult latch;Infant < 6lbs;Early term 37-38.6wks   Follow up with mom of 2 hour old infant. Infant with 9 BF for 10-40 minutes, EBM x 3 (per mom) 7cc - 28 cc via bottle, 5 voids and 9 stools in the last 24 hours. Infant weight 5 lb 11.7 oz with 8% weight loss since birth. LATCH score 6.   Mom was attempting to latch infant when [facility] went into room. Infant was crying and would not latch. Nipple was elongated on the top of the nipple indicating infant not latched deeply. Gave infant EBM in a spoon and got her calmed and then was able to try to latch the infant. Infant would not latch. Applied # 24 NS and was able to latch infant. Infant fed with great swallows and breast softening. Nipple was rounded when infant came off the breast. Mom did note some pain with the feeding that improved with chin tug. Milk very easily expressible.   Discussed with mom the importance of using the nipple shield if nipple remains compressed/asymmetrical for better milk transfer and to limit nipple trauma. Mom with positional stripe to the left nipple. Mom has not been feeding on the left side, discussed importance of pumping a breast is not being used to protect milk supply.   Enc mom to feed infant STS 8-12 x in 24 hours at first feeding cues with no longer than 3 hours between feeds.  Enc mom to use # 24 NS with the feedings for best milk flow prepump to soften areola as needed before latch Pump 4-6 x a day post BF to protect milk supply Offer infant bottle of pumped EBM after BF if still cueing to feed Breast shells between feedings   Mom has Lansinoh pump at home for use. Mom to call WIC if Lans[employer]oh pump is not working well for her. Mom has spoken to [facility] and they have a waiting list for pumps currently.   Reviewed I/O, signs  of dehydration in the infant, engorgement prevention/treatment, and expressing and storage of breast milk. [facility] Brochure given, mom aware of OP services, BF Support Groups and [place] phone #. Mom agreeable to follow up [facility] appointment later this week or early next week. In basket message sent to [facility] to call mom and schedule appt.   Report on feeding to Dr. Juanito Doom.      Maternal Data Formula Feeding for Exclusion: No Has patient been taught Hand Expression?: Yes Does the patient have breastfeeding experience prior to this delivery?: No  Feeding Feeding Type: Breast Fed Length of feed: 20 min  LATCH Score Latch: Grasps breast easily, tongue down, lips flanged, rhythmical sucking.  Audible Swallowing: Spontaneous and intermittent  Type of Nipple: Everted at rest and after stimulation  Comfort (Breast/Nipple): Filling, red/small blisters or bruises, mild/mod discomfort  Hold (Positioning): Assistance needed to correctly position infant at breast and maintain latch.  LATCH Score: 8  Interventions Interventions: Breast feeding basics reviewed;Support pillows;Assisted with latch;Position options;Skin to skin;Expressed milk;Breast massage;Hand express;Breast compression;Reverse pressure;Pre-pump if needed;Adjust position;DEBP;Hand pump;Shells;Coconut oil  Lactation Tools Discussed/Used Tools: Shells;Pump;Coconut oil;Nipple Shields Nipple shield size: 24 Shell Type: Inverted Breast pump type: Double-Electric Breast Pump;Manual WIC Program: Yes Pump Review: Setup, frequency, and cleaning;Milk Storage Initiated by:: Reviewed and encouraged 4-6 x a day post BF   Consult Status Consult Status: Follow-up Follow-up type: Out-patient  Debby Freiberg Kenshin Splawn 01/13/2018, 9:37 AM

## 2018-01-13 NOTE — Discharge Instructions (Signed)
Vaginal Delivery, Care After °Refer to this sheet in the next few weeks. These instructions provide you with information about caring for yourself after vaginal delivery. Your health care provider may also give you more specific instructions. Your treatment has been planned according to current medical practices, but problems sometimes occur. Call your health care provider if you have any problems or questions. °What can I expect after the procedure? °After vaginal delivery, it is common to have: °· Some bleeding from your vagina. °· Soreness in your abdomen, your vagina, and the area of skin between your vaginal opening and your anus (perineum). °· Pelvic cramps. °· Fatigue. ° °Follow these instructions at home: °Medicines °· Take over-the-counter and prescription medicines only as told by your health care provider. °· If you were prescribed an antibiotic medicine, take it as told by your health care provider. Do not stop taking the antibiotic until it is finished. °Driving ° °· Do not drive or operate heavy machinery while taking prescription pain medicine. °· Do not drive for 24 hours if you received a sedative. °Lifestyle °· Do not drink alcohol. This is especially important if you are breastfeeding or taking medicine to relieve pain. °· Do not use tobacco products, including cigarettes, chewing tobacco, or e-cigarettes. If you need help quitting, ask your health care provider. °Eating and drinking °· Drink at least 8 eight-ounce glasses of water every day unless you are told not to by your health care provider. If you choose to breastfeed your baby, you may need to drink more water than this. °· Eat high-fiber foods every day. These foods may help prevent or relieve constipation. High-fiber foods include: °? Whole grain cereals and breads. °? Brown rice. °? Beans. °? Fresh fruits and vegetables. °Activity °· Return to your normal activities as told by your health care provider. Ask your health care provider  what activities are safe for you. °· Rest as much as possible. Try to rest or take a nap when your baby is sleeping. °· Do not lift anything that is heavier than your baby or 10 lb (4.5 kg) until your health care provider says that it is safe. °· Talk with your health care provider about when you can engage in sexual activity. This may depend on your: °? Risk of infection. °? Rate of healing. °? Comfort and desire to engage in sexual activity. °Vaginal Care °· If you have an episiotomy or a vaginal tear, check the area every day for signs of infection. Check for: °? More redness, swelling, or pain. °? More fluid or blood. °? Warmth. °? Pus or a bad smell. °· Do not use tampons or douches until your health care provider says this is safe. °· Watch for any blood clots that may pass from your vagina. These may look like clumps of dark red, brown, or black discharge. °General instructions °· Keep your perineum clean and dry as told by your health care provider. °· Wear loose, comfortable clothing. °· Wipe from front to back when you use the toilet. °· Ask your health care provider if you can shower or take a bath. If you had an episiotomy or a perineal tear during labor and delivery, your health care provider may tell you not to take baths for a certain length of time. °· Wear a bra that supports your breasts and fits you well. °· If possible, have someone help you with household activities and help care for your baby for at least a few days after   you leave the hospital. °· Keep all follow-up visits for you and your baby as told by your health care provider. This is important. °Contact a health care provider if: °· You have: °? Vaginal discharge that has a bad smell. °? Difficulty urinating. °? Pain when urinating. °? A sudden increase or decrease in the frequency of your bowel movements. °? More redness, swelling, or pain around your episiotomy or vaginal tear. °? More fluid or blood coming from your episiotomy or  vaginal tear. °? Pus or a bad smell coming from your episiotomy or vaginal tear. °? A fever. °? A rash. °? Little or no interest in activities you used to enjoy. °? Questions about caring for yourself or your baby. °· Your episiotomy or vaginal tear feels warm to the touch. °· Your episiotomy or vaginal tear is separating or does not appear to be healing. °· Your breasts are painful, hard, or turn red. °· You feel unusually sad or worried. °· You feel nauseous or you vomit. °· You pass large blood clots from your vagina. If you pass a blood clot from your vagina, save it to show to your health care provider. Do not flush blood clots down the toilet without having your health care provider look at them. °· You urinate more than usual. °· You are dizzy or light-headed. °· You have not breastfed at all and you have not had a menstrual period for 12 weeks after delivery. °· You have stopped breastfeeding and you have not had a menstrual period for 12 weeks after you stopped breastfeeding. °Get help right away if: °· You have: °? Pain that does not go away or does not get better with medicine. °? Chest pain. °? Difficulty breathing. °? Blurred vision or spots in your vision. °? Thoughts about hurting yourself or your baby. °· You develop pain in your abdomen or in one of your legs. °· You develop a severe headache. °· You faint. °· You bleed from your vagina so much that you fill two sanitary pads in one hour. °This information is not intended to replace advice given to you by your health care provider. Make sure you discuss any questions you have with your health care provider. °Document Released: 11/02/2000 Document Revised: 04/18/2016 Document Reviewed: 11/20/2015 °Elsevier Interactive Patient Education © 2018 [employer] ° °

## 2018-01-13 NOTE — Discharge Summary (Addendum)
OB Discharge Summary     Patient Name: Janet Pollard DOB: 07-Nov-1991 MRN: [id number]  Date of admission: 01/09/2018 Delivering MD: Katheren Shams   Date of discharge: 01/13/2018  Admitting diagnosis: INDUCTION Intrauterine pregnancy: [redacted]w[redacted]d     Secondary diagnosis:  Active Problems:   Cholestasis during pregnancy in third trimester  Additional problems: cholestasis     Discharge diagnosis: Term Pregnancy Delivered                                                                                                Post partum procedures:none  Augmentation: Pitocin, Cytotec and Foley Balloon  Complications: None  Hospital course:  Induction of Labor With Vaginal Delivery   27 y.o. yo G1P1001 at [redacted]w[redacted]d was admitted to the hospital 01/09/2018 for induction of labor.  Indication for induction: Cholestasis of pregnancy.  Patient had an uncomplicated labor course as follows: Membrane Rupture Time/Date: 1:26 PM ,01/10/2018   Intrapartum Procedures: Episiotomy: None [1]                                         Lacerations:  None [1]  Patient had delivery of a Viable infant.  Information for the patient's newborn:  Breionna, Punt [[id number]]  Delivery Method: Vaginal, Spontaneous(Filed from Delivery Summary)   01/11/2018  Details of delivery can be found in separate delivery note.  Patient had a routine postpartum course. Patient is discharged home 01/13/18.  Physical exam  Vitals:   01/12/18 0100 01/12/18 0521 01/12/18 1821 01/13/18 0538  BP:  112/60 117/70 (!) 103/57  Pulse:  79 77 76  Resp:  18 16 16   Temp:  98.3 F (36.8 C) (!) 97.4 F (36.3 C) 98.4 F (36.9 C)  TempSrc:  Oral Oral Oral  SpO2:   100% 100%  Weight: 83.1 kg (183 lb 3 oz)     Height:       General: alert, cooperative and no distress Lochia: appropriate Uterine Fundus: firm Incision: N/A DVT Evaluation: No evidence of DVT seen on physical exam. No cords or calf tenderness. Labs: Lab Results  Component  Value Date   WBC 15.2 (H) 01/10/2018   HGB 10.9 (L) 01/10/2018   HCT 32.4 (L) 01/10/2018   MCV 91.0 01/10/2018   PLT 280 01/10/2018   CMP Latest Ref Rng & Units 01/10/2018  Glucose 65 - 99 mg/dL 84  BUN 6 - 20 mg/dL 5(L)  Creatinine 0.44 - 1.00 mg/dL 0.67  Sodium 135 - 145 mmol/L 135  Potassium 3.5 - 5.1 mmol/L 3.6  Chloride 101 - 111 mmol/L 105  CO2 22 - 32 mmol/L 20(L)  Calcium 8.9 - 10.3 mg/dL 8.8(L)  Total Protein 6.5 - 8.1 g/dL 6.9  Total Bilirubin 0.3 - 1.2 mg/dL 1.1  Alkaline Phos 38 - 126 U/L 85  AST 15 - 41 U/L 25  ALT 14 - 54 U/L 19    Discharge instruction: per After Visit Summary and "Baby and Me Booklet".  After visit meds:  Allergies  as of 01/13/2018   No Known Allergies     Medication List    STOP taking these medications   BENADRYL CHILDRENS ALLERGY 12.5 MG/5ML liquid Generic drug:  diphenhydrAMINE   promethazine 25 MG tablet Commonly known as:  PHENERGAN   triamcinolone cream 0.1 % Commonly known as:  KENALOG     TAKE these medications   Prenatal Adult Gummy/DHA/FA 0.4-25 MG Chew Chew 2 tablets by mouth daily.       Diet: routine diet  Activity: Advance as tolerated. Pelvic rest for 6 weeks.   Outpatient follow up:4 weeks Follow up Appt:No future appointments. Follow up Visit:No Follow-up on file.  Postpartum contraception: Condoms. Patient well versed on types of birth control. Also mentions family planning. Asked patient to consider IUD as safe with breastfeeding and more effective. Discussed recommended pregnancy intervals.   Newborn Data: Live born female  Birth Weight: 6 lb 3.3 oz (2815 g) APGAR: 15, 9  Newborn Delivery   Birth date/time:  01/11/2018 03:33:00 Delivery type:  Vaginal, Spontaneous     Baby Feeding: Breast Disposition:home with mother   01/13/2018 Ralene Ok, MD

## 2018-01-17 ENCOUNTER — Encounter (HOSPITAL_COMMUNITY): Payer: Self-pay

## 2018-01-17 ENCOUNTER — Inpatient Hospital Stay (HOSPITAL_COMMUNITY)
Admission: AD | Admit: 2018-01-17 | Discharge: 2018-01-19 | DRG: 776 | Disposition: A | Discharge status: Home / Self Care | Payer: Medicaid Other | Source: Ambulatory Visit | Attending: Family Medicine | Admitting: Family Medicine

## 2018-01-17 ENCOUNTER — Other Ambulatory Visit: Payer: Self-pay

## 2018-01-17 DIAGNOSIS — Z87891 Personal history of nicotine dependence: Secondary | ICD-10-CM | POA: Diagnosis not present

## 2018-01-17 DIAGNOSIS — O1415 Severe pre-eclampsia, complicating the puerperium: Principal | ICD-10-CM | POA: Diagnosis present

## 2018-01-17 DIAGNOSIS — R030 Elevated blood-pressure reading, without diagnosis of hypertension: Secondary | ICD-10-CM | POA: Diagnosis not present

## 2018-01-17 LAB — PROTEIN / CREATININE RATIO, URINE
Creatinine, Urine: 68 mg/dL
Total Protein, Urine: <6 mg/dL

## 2018-01-17 LAB — COMPREHENSIVE METABOLIC PANEL
ALBUMIN: 3 g/dL — AB (ref 3.5–5.0)
ALK PHOS: 109 U/L (ref 38–126)
ALT: 138 U/L — AB (ref 14–54)
ANION GAP: 8 (ref 5–15)
AST: 76 U/L — AB (ref 15–41)
BUN: 9 mg/dL (ref 6–20)
CO2: 22 mmol/L (ref 22–32)
CREATININE: 0.68 mg/dL (ref 0.44–1.00)
Calcium: 8.5 mg/dL — ABNORMAL LOW (ref 8.9–10.3)
Chloride: 108 mmol/L (ref 101–111)
GFR calc Af Amer: >60 mL/min (ref >60)
GFR calc non Af Amer: >60 mL/min (ref >60)
GLUCOSE: 91 mg/dL (ref 65–99)
Potassium: 3.7 mmol/L (ref 3.5–5.1)
SODIUM: 138 mmol/L (ref 135–145)
Total Bilirubin: 0.5 mg/dL (ref 0.3–1.2)
Total Protein: 6.9 g/dL (ref 6.5–8.1)

## 2018-01-17 LAB — URINALYSIS, ROUTINE W REFLEX MICROSCOPIC
Bilirubin Urine: NEGATIVE
Glucose, UA: NEGATIVE mg/dL
KETONES UR: NEGATIVE mg/dL
Leukocytes, UA: NEGATIVE
Nitrite: NEGATIVE
PROTEIN: NEGATIVE mg/dL
Specific Gravity, Urine: 1.008 (ref 1.005–1.030)
pH: 6 (ref 5.0–8.0)

## 2018-01-17 LAB — CBC
HCT: 31.1 % — ABNORMAL LOW (ref 36.0–46.0)
HEMOGLOBIN: 10.3 g/dL — AB (ref 12.0–15.0)
MCH: 30.2 pg (ref 26.0–34.0)
MCHC: 33.1 g/dL (ref 30.0–36.0)
MCV: 91.2 fL (ref 78.0–100.0)
Platelets: 288 10*3/uL (ref 150–400)
RBC: 3.41 MIL/uL — AB (ref 3.87–5.11)
RDW: 14.2 % (ref 11.5–15.5)
WBC: 7.1 10*3/uL (ref 4.0–10.5)

## 2018-01-17 MED ORDER — HYDRALAZINE HCL 20 MG/ML IJ SOLN: 10.0000 mg | Freq: Once | INTRAMUSCULAR | Status: DC | PRN

## 2018-01-17 MED ORDER — LACTATED RINGERS IV SOLN
INTRAVENOUS | Status: DC
  Administered 2018-01-17 – 2018-01-18 (×3): via INTRAVENOUS

## 2018-01-17 MED ORDER — LABETALOL HCL 5 MG/ML IV SOLN
20.0000 mg | INTRAVENOUS | Status: DC | PRN
  Administered 2018-01-17: 20 mg via INTRAVENOUS
  Filled 2018-01-17: qty 4

## 2018-01-17 MED ORDER — HYDROCHLOROTHIAZIDE 25 MG PO TABS
25.0000 mg | ORAL_TABLET | Freq: Once | ORAL | Status: AC
  Administered 2018-01-17: 25 mg via ORAL
  Filled 2018-01-17: qty 1

## 2018-01-17 MED ORDER — MAGNESIUM SULFATE BOLUS VIA INFUSION
4.0000 g | Freq: Once | INTRAVENOUS | Status: AC
  Administered 2018-01-17: 4 g via INTRAVENOUS
  Filled 2018-01-17: qty 500

## 2018-01-17 MED ORDER — ACETAMINOPHEN 325 MG PO TABS: 650.0000 mg | ORAL_TABLET | Freq: Four times a day (QID) | ORAL | Status: DC | PRN

## 2018-01-17 MED ORDER — MAGNESIUM SULFATE 40 G IN LACTATED RINGERS - SIMPLE
2.0000 g/h | INTRAVENOUS | Status: AC
  Administered 2018-01-17 – 2018-01-18 (×2): 2 g/h via INTRAVENOUS
  Filled 2018-01-17: qty 500
  Filled 2018-01-17: qty 40

## 2018-01-17 MED ORDER — LABETALOL HCL 5 MG/ML IV SOLN: 20.0000 mg | INTRAVENOUS | Status: DC | PRN

## 2018-01-17 MED ORDER — ACETAMINOPHEN 650 MG RE SUPP: 650.0000 mg | Freq: Four times a day (QID) | RECTAL | Status: DC | PRN

## 2018-01-17 MED ORDER — BUTALBITAL-APAP-CAFFEINE 50-325-40 MG PO TABS
1.0000 | ORAL_TABLET | Freq: Once | ORAL | Status: AC
  Administered 2018-01-17: 1 via ORAL
  Filled 2018-01-17: qty 1

## 2018-01-17 MED ORDER — IBUPROFEN 600 MG PO TABS: 600.0000 mg | ORAL_TABLET | Freq: Four times a day (QID) | ORAL | Status: DC | PRN

## 2018-01-17 NOTE — H&P (Signed)
History     Chief Complaint  Patient presents with  . Hypertension   HPI Breeanne Oblinger is a 27 y.o. G1P1001 whom is 6 days postpartum from NSVD. Patient was seen today by home nurse postpartum and was noted to have elevated blood pressure. Patient also with significant leg edema. She states that she has been feeling fine. Endorsing a throbbing headache that has been present for 2 days. Yesterday she took ibuprofen which helped it but today she has not had anything. Headache rated 7/10. She denies vision changes. Denies fevers, abdominal pain, dyspnea. Patient had  No blood pressure issues during antepartum or intrapartum period. Breast and bottle feeding.    Past Medical History:  Diagnosis Date  . History of stomach ulcers 2011  . Ovarian cyst 2011    Past Surgical History:  Procedure Laterality Date  . WISDOM TOOTH EXTRACTION      Family History  Problem Relation Age of Onset  . Diabetes Father     Social History   Tobacco Use  . Smoking status: Former Smoker    Packs/day: 0.25    Types: Cigarettes    Last attempt to quit: 06/08/2017    Years since quitting: 0.6  . Smokeless tobacco: Never Used  Substance Use Topics  . Alcohol use: No  . Drug use: No    Allergies: No Known Allergies  Medications Prior to Admission  Medication Sig Dispense Refill Last Dose  . Prenatal MV & Min w/FA-DHA (PRENATAL ADULT GUMMY/DHA/FA) 0.4-25 MG CHEW Chew 2 tablets by mouth daily.   01/08/2018 at Unknown time    Review of Systems  All systems reviewed and are negative for acute change except as noted in the HPI.  Physical Exam   Blood pressure (!) 167/99, pulse 64, temperature 98.7 F (37.1 C), temperature source Oral, resp. rate 18, height 5\' 2"  (1.575 m), weight 84.9 kg (187 lb 4 oz), last menstrual period 04/25/2017, SpO2 99 %, unknown if currently breastfeeding.  Physical Exam  Nursing note and vitals reviewed. Constitutional: She is oriented to person, place, and time. She  appears well-developed and well-nourished.  HENT:  Head: Normocephalic and atraumatic.  Eyes: EOM are normal.  Neck: Normal range of motion.  Cardiovascular: Normal rate and regular rhythm.  Respiratory: Effort normal and breath sounds normal.  GI: Soft. She exhibits no distension. There is no tenderness. There is no guarding.  Uterus 3cm below umbilicus.   Musculoskeletal: Normal range of motion. She exhibits edema. She exhibits no tenderness.  Neurological: She is alert and oriented to person, place, and time.  Skin: Skin is warm and dry.  Psychiatric: She has a normal mood and affect.   Results for orders placed or performed during the hospital encounter of 01/17/18  Urinalysis, Routine w reflex microscopic  Result Value Ref Range   Color, Urine YELLOW YELLOW   APPearance CLEAR CLEAR   Specific Gravity, Urine 1.008 1.005 - 1.030   pH 6.0 5.0 - 8.0   Glucose, UA NEGATIVE NEGATIVE mg/dL   Hgb urine dipstick LARGE (A) NEGATIVE   Bilirubin Urine NEGATIVE NEGATIVE   Ketones, ur NEGATIVE NEGATIVE mg/dL   Protein, ur NEGATIVE NEGATIVE mg/dL   Nitrite NEGATIVE NEGATIVE   Leukocytes, UA NEGATIVE NEGATIVE   RBC / HPF 0-5 0 - 5 RBC/hpf   WBC, UA 0-5 0 - 5 WBC/hpf   Bacteria, UA RARE (A) NONE SEEN   Squamous Epithelial / LPF 0-5 (A) NONE SEEN   Mucus PRESENT   CBC  Result Value Ref Range   WBC 7.1 4.0 - 10.5 K/uL   RBC 3.41 (L) 3.87 - 5.11 MIL/uL   Hemoglobin 10.3 (L) 12.0 - 15.0 g/dL   HCT 31.1 (L) 36.0 - 46.0 %   MCV 91.2 78.0 - 100.0 fL   MCH 30.2 26.0 - 34.0 pg   MCHC 33.1 30.0 - 36.0 g/dL   RDW 14.2 11.5 - 15.5 %   Platelets 288 150 - 400 K/uL  Comprehensive metabolic panel  Result Value Ref Range   Sodium 138 135 - 145 mmol/L   Potassium 3.7 3.5 - 5.1 mmol/L   Chloride 108 101 - 111 mmol/L   CO2 22 22 - 32 mmol/L   Glucose, Bld 91 65 - 99 mg/dL   BUN 9 6 - 20 mg/dL   Creatinine, Ser 0.68 0.44 - 1.00 mg/dL   Calcium 8.5 (L) 8.9 - 10.3 mg/dL   Total Protein 6.9 6.5 -  8.1 g/dL   Albumin 3.0 (L) 3.5 - 5.0 g/dL   AST 76 (H) 15 - 41 U/L   ALT 138 (H) 14 - 54 U/L   Alkaline Phosphatase 109 38 - 126 U/L   Total Bilirubin 0.5 0.3 - 1.2 mg/dL   GFR calc non Af Amer >60 >60 mL/min   GFR calc Af Amer >60 >60 mL/min   Anion gap 8 5 - 15  Protein / creatinine ratio, urine  Result Value Ref Range   Creatinine, Urine 68.00 mg/dL   Total Protein, Urine <6 mg/dL   Protein Creatinine Ratio        0.00 - 0.15 mg/mg[Cre]    MDM Labs with elevated LFTs BPs elevated s/p oral HCTZ. Continued to be severe range so labetalol protocol started CBC with anemia UA wnl; P/C ratio normal  Treatment in MAU: -Fiorcet for headache without improvement  Assessment and Plan  1. Elevated BPs postpartum 2. Atypical postpartum HELLP 3. Edema  -Admit -Treat with Magnesium for 24hrs -Repeat labs in AM -Labetalol protocol prn   Luiz Blare, DO 01/17/2018, 6:20 PM

## 2018-01-17 NOTE — MAU Provider Note (Signed)
History     CSN: [id number]  Arrival date and time: 01/17/18 1730   None     Chief Complaint  Patient presents with  . Hypertension   HPI Janet Pollard is a 27 y.o. G1P1001 whom is 6 days postpartum from NSVD. Patient was seen today by home nurse postpartum and was noted to have elevated blood pressure. Patient also with significant leg edema. She states that she has been feeling fine. Endorsing a throbbing headache that has been present for 2 days. Yesterday she took ibuprofen which helped it but today she has not had anything. Headache rated 7/10. She denies vision changes. Denies fevers, abdominal pain, dyspnea. Patient had  No blood pressure issues during antepartum or intrapartum period. Breast and bottle feeding.    Past Medical History:  Diagnosis Date  . History of stomach ulcers 2011  . Ovarian cyst 2011    Past Surgical History:  Procedure Laterality Date  . WISDOM TOOTH EXTRACTION      Family History  Problem Relation Age of Onset  . Diabetes Father     Social History   Tobacco Use  . Smoking status: Former Smoker    Packs/day: 0.25    Types: Cigarettes    Last attempt to quit: 06/08/2017    Years since quitting: 0.6  . Smokeless tobacco: Never Used  Substance Use Topics  . Alcohol use: No  . Drug use: No    Allergies: No Known Allergies  Medications Prior to Admission  Medication Sig Dispense Refill Last Dose  . Prenatal MV & Min w/FA-DHA (PRENATAL ADULT GUMMY/DHA/FA) 0.4-25 MG CHEW Chew 2 tablets by mouth daily.   01/08/2018 at Unknown time    Review of Systems  All systems reviewed and are negative for acute change except as noted in the HPI.  Physical Exam   Blood pressure (!) 167/99, pulse 64, temperature 98.7 F (37.1 C), temperature source Oral, resp. rate 18, height 5\' 2"  (1.575 m), weight 84.9 kg (187 lb 4 oz), last menstrual period 04/25/2017, SpO2 99 %, unknown if currently breastfeeding.  Physical Exam  Nursing note and vitals  reviewed. Constitutional: She is oriented to person, place, and time. She appears well-developed and well-nourished.  HENT:  Head: Normocephalic and atraumatic.  Eyes: EOM are normal.  Neck: Normal range of motion.  Cardiovascular: Normal rate and regular rhythm.  Respiratory: Effort normal and breath sounds normal.  GI: Soft. She exhibits no distension. There is no tenderness. There is no guarding.  Uterus 3cm below umbilicus.   Musculoskeletal: Normal range of motion. She exhibits edema. She exhibits no tenderness.  Neurological: She is alert and oriented to person, place, and time.  Skin: Skin is warm and dry.  Psychiatric: She has a normal mood and affect.   Results for orders placed or performed during the hospital encounter of 01/17/18  Urinalysis, Routine w reflex microscopic  Result Value Ref Range   Color, Urine YELLOW YELLOW   APPearance CLEAR CLEAR   Specific Gravity, Urine 1.008 1.005 - 1.030   pH 6.0 5.0 - 8.0   Glucose, UA NEGATIVE NEGATIVE mg/dL   Hgb urine dipstick LARGE (A) NEGATIVE   Bilirubin Urine NEGATIVE NEGATIVE   Ketones, ur NEGATIVE NEGATIVE mg/dL   Protein, ur NEGATIVE NEGATIVE mg/dL   Nitrite NEGATIVE NEGATIVE   Leukocytes, UA NEGATIVE NEGATIVE   RBC / HPF 0-5 0 - 5 RBC/hpf   WBC, UA 0-5 0 - 5 WBC/hpf   Bacteria, UA RARE (A) NONE SEEN  Squamous Epithelial / LPF 0-5 (A) NONE SEEN   Mucus PRESENT   CBC  Result Value Ref Range   WBC 7.1 4.0 - 10.5 K/uL   RBC 3.41 (L) 3.87 - 5.11 MIL/uL   Hemoglobin 10.3 (L) 12.0 - 15.0 g/dL   HCT 31.1 (L) 36.0 - 46.0 %   MCV 91.2 78.0 - 100.0 fL   MCH 30.2 26.0 - 34.0 pg   MCHC 33.1 30.0 - 36.0 g/dL   RDW 14.2 11.5 - 15.5 %   Platelets 288 150 - 400 K/uL  Comprehensive metabolic panel  Result Value Ref Range   Sodium 138 135 - 145 mmol/L   Potassium 3.7 3.5 - 5.1 mmol/L   Chloride 108 101 - 111 mmol/L   CO2 22 22 - 32 mmol/L   Glucose, Bld 91 65 - 99 mg/dL   BUN 9 6 - 20 mg/dL   Creatinine, Ser 0.68 0.44 -  1.00 mg/dL   Calcium 8.5 (L) 8.9 - 10.3 mg/dL   Total Protein 6.9 6.5 - 8.1 g/dL   Albumin 3.0 (L) 3.5 - 5.0 g/dL   AST 76 (H) 15 - 41 U/L   ALT 138 (H) 14 - 54 U/L   Alkaline Phosphatase 109 38 - 126 U/L   Total Bilirubin 0.5 0.3 - 1.2 mg/dL   GFR calc non Af Amer >60 >60 mL/min   GFR calc Af Amer >60 >60 mL/min   Anion gap 8 5 - 15  Protein / creatinine ratio, urine  Result Value Ref Range   Creatinine, Urine 68.00 mg/dL   Total Protein, Urine <6 mg/dL   Protein Creatinine Ratio        0.00 - 0.15 mg/mg[Cre]    MAU Course  Procedures  MDM Labs with elevated LFTs BPs elevated s/p oral HCTZ. Continued to be severe range so labetalol protocol started CBC with anemia UA wnl; P/C ratio normal Treatment in MAU: -Fiorcet for headache without improvement  Discussed with Dr. Kennon Rounds whom agrees with plan  Assessment and Plan  1. Elevated BPs postpartum 2. Atypical postpartum HELLP 3. Edema  Admit for treatment. Please see H&P for details.   Luiz Blare, DO 01/17/2018, 6:20 PM

## 2018-01-17 NOTE — MAU Note (Addendum)
Pt presents with c/o ^BP.  Reports had home visit today and BP elaevated.  Instructed to be seen in MAU for eval.  Reports throbbing H/A in occipital area.  Denies visual disturbances or epigastric pain.  Edema in feet & ankles. Pt s/p NVS 01/11/2018

## 2018-01-18 ENCOUNTER — Encounter (HOSPITAL_COMMUNITY): Payer: Self-pay

## 2018-01-18 DIAGNOSIS — O1415 Severe pre-eclampsia, complicating the puerperium: Principal | ICD-10-CM

## 2018-01-18 LAB — CBC
HCT: 30.3 % — ABNORMAL LOW (ref 36.0–46.0)
Hemoglobin: 10.1 g/dL — ABNORMAL LOW (ref 12.0–15.0)
MCH: 30.2 pg (ref 26.0–34.0)
MCHC: 33.3 g/dL (ref 30.0–36.0)
MCV: 90.7 fL (ref 78.0–100.0)
Platelets: 290 10*3/uL (ref 150–400)
RBC: 3.34 MIL/uL — AB (ref 3.87–5.11)
RDW: 14.2 % (ref 11.5–15.5)
WBC: 6.9 10*3/uL (ref 4.0–10.5)

## 2018-01-18 LAB — COMPREHENSIVE METABOLIC PANEL
ALT: 115 U/L — ABNORMAL HIGH (ref 14–54)
AST: 48 U/L — ABNORMAL HIGH (ref 15–41)
Albumin: 2.8 g/dL — ABNORMAL LOW (ref 3.5–5.0)
Alkaline Phosphatase: 94 U/L (ref 38–126)
Anion gap: 11 (ref 5–15)
BUN: 8 mg/dL (ref 6–20)
CALCIUM: 7.8 mg/dL — AB (ref 8.9–10.3)
CHLORIDE: 103 mmol/L (ref 101–111)
CO2: 22 mmol/L (ref 22–32)
Creatinine, Ser: 0.81 mg/dL (ref 0.44–1.00)
GFR calc non Af Amer: >60 mL/min (ref >60)
Glucose, Bld: 99 mg/dL (ref 65–99)
Potassium: 3 mmol/L — ABNORMAL LOW (ref 3.5–5.1)
SODIUM: 136 mmol/L (ref 135–145)
Total Bilirubin: 0.5 mg/dL (ref 0.3–1.2)
Total Protein: 6.7 g/dL (ref 6.5–8.1)

## 2018-01-18 MED ORDER — AMLODIPINE BESYLATE 5 MG PO TABS
5.0000 mg | ORAL_TABLET | Freq: Every day | ORAL | Status: DC
  Administered 2018-01-18 – 2018-01-19 (×2): 5 mg via ORAL
  Filled 2018-01-18 (×2): qty 1

## 2018-01-18 NOTE — Progress Notes (Signed)
Patient ID: Janet Pollard, female   DOB: January 26, 1991, 27 y.o.   MRN: [id number]   Subjective: Interval History: headache is mildly improved. She is breast feeding. Bleeding is normal.  Objective: Vital signs in last 24 hours: Temp:  [97.8 F (36.6 C)-98.7 F (37.1 C)] 97.8 F (36.6 C) (03/02 0601) Pulse Rate:  [59-86] 69 (03/02 0601) Resp:  [16-20] 18 (03/02 0703) BP: (120-168)/(64-100) 133/80 (03/02 0601) SpO2:  [98 %-100 %] 98 % (03/02 0600) Weight:  [186 lb 0.6 oz (84.4 kg)-187 lb 4 oz (84.9 kg)] 186 lb 0.6 oz (84.4 kg) (03/02 0501)  Intake/Output from previous day: 03/01 0701 - 03/02 0700 In: 2005.8 [P.O.:837; I.V.:1168.8] Out: 2425 [Urine:2425] Intake/Output this shift: No intake/output data recorded.  General appearance: alert, cooperative and appears stated age Neck: supple, symmetrical, trachea midline Lungs: normal effort Heart: regular rate and rhythm Abdomen: soft, non-tender; bowel sounds normal; no masses,  no organomegaly Extremities: extremities normal, atraumatic, no cyanosis or edema  CMP Latest Ref Rng & Units 01/18/2018 01/17/2018 01/10/2018  Glucose 65 - 99 mg/dL 99 91 84  BUN 6 - 20 mg/dL 8 9 5(L)  Creatinine 0.44 - 1.00 mg/dL 0.81 0.68 0.67  Sodium 135 - 145 mmol/L 136 138 135  Potassium 3.5 - 5.1 mmol/L 3.0(L) 3.7 3.6  Chloride 101 - 111 mmol/L 103 108 105  CO2 22 - 32 mmol/L 22 22 20(L)  Calcium 8.9 - 10.3 mg/dL 7.8(L) 8.5(L) 8.8(L)  Total Protein 6.5 - 8.1 g/dL 6.7 6.9 6.9  Total Bilirubin 0.3 - 1.2 mg/dL 0.5 0.5 1.1  Alkaline Phos 38 - 126 U/L 94 109 85  AST 15 - 41 U/L 48(H) 76(H) 25  ALT 14 - 54 U/L 115(H) 138(H) 19   CBC Latest Ref Rng & Units 01/18/2018 01/17/2018 01/10/2018  WBC 4.0 - 10.5 K/uL 6.9 7.1 15.2(H)  Hemoglobin 12.0 - 15.0 g/dL 10.1(L) 10.3(L) 10.9(L)  Hematocrit 36.0 - 46.0 % 30.3(L) 31.1(L) 32.4(L)  Platelets 150 - 400 K/uL 290 288 280    Scheduled Meds: Continuous Infusions: . lactated ringers 100 mL/hr at 01/18/18 0605  .  magnesium sulfate 2 g/hr (01/17/18 2221)   PRN Meds:acetaminophen **OR** acetaminophen, hydrALAZINE, hydrALAZINE, ibuprofen, labetalol, labetalol  Assessment/Plan: Active Problems:   Hypertension in pregnancy, preeclampsia, severe, delivered/postpartum  On Magnesium Sulfate x 24 hours Monitor BP once off--add CCB I/O's - 400   LOS: 1 day   Donnamae Jude, MD 01/18/2018 7:27 AM

## 2018-01-19 MED ORDER — AMLODIPINE BESYLATE 5 MG PO TABS: 5.0000 mg | ORAL_TABLET | Freq: Every day | ORAL | 1 refills | Status: DC

## 2018-01-19 MED ORDER — IBUPROFEN 600 MG PO TABS: 600.0000 mg | ORAL_TABLET | Freq: Four times a day (QID) | ORAL | 0 refills | Status: DC | PRN

## 2018-01-19 NOTE — Progress Notes (Signed)

## 2018-01-19 NOTE — Discharge Summary (Signed)
Physician Discharge Summary  Patient ID: Janet Pollard MRN: [id number] DOB/AGE: 1991-02-07 27 y.o.  Admit date: 01/17/2018 Discharge date: 01/19/2018  Admission Diagnoses: PP PEC  Discharge Diagnoses:  Active Problems:   Hypertension in pregnancy, preeclampsia, severe, delivered/postpartum   Discharged Condition: good  Hospital Course: Pt was admitted 6 days PP with SPEC. Received magnesium x 24 hours and started on Norvasc. Pt responded well to magnesium and BP. BP stabilized to 110's-130's/70's. Sx of PEC resolved. Ambulating, voiding, tolerating diet without problems. Amendable for discharge home.   Consults: None  Significant Diagnostic Studies: labs  Treatments: IV hydration and magnesium  Discharge Exam: Blood pressure 131/88, pulse 68, temperature 98.6 F (37 C), temperature source Oral, resp. rate 18, height 5\' 2"  (1.575 m), weight 74 kg (163 lb 0.6 oz), last menstrual period 04/25/2017, SpO2 100 %, unknown if currently breastfeeding.  Lungs clear Heart RRR Abd soft + BS uterus firm U-3 Ext non tender  Disposition: 01-Home or Self Care  Discharge Instructions    Activity as tolerated   Complete by:  As directed    Call MD for:  difficulty breathing, headache or visual disturbances   Complete by:  As directed    Call MD for:  hives   Complete by:  As directed    Call MD for:  persistant dizziness or light-headedness   Complete by:  As directed    Call MD for:  persistant nausea and vomiting   Complete by:  As directed    Call MD for:  redness, tenderness, or signs of infection (pain, swelling, redness, odor or green/yellow discharge around incision site)   Complete by:  As directed    Call MD for:  severe uncontrolled pain   Complete by:  As directed    Call MD for:  temperature >100.4   Complete by:  As directed    Diet - low sodium heart healthy   Complete by:  As directed    Sexual acrtivity   Complete by:  As directed    Pelvic rest x 6 weeks      Allergies as of 01/19/2018   No Known Allergies     Medication List    TAKE these medications   amLODipine 5 MG tablet Commonly known as:  NORVASC Take 1 tablet (5 mg total) by mouth daily.   ibuprofen 600 MG tablet Commonly known as:  ADVIL,MOTRIN Take 1 tablet (600 mg total) by mouth every 6 (six) hours as needed for moderate pain.   Prenatal Adult Gummy/DHA/FA 0.4-25 MG Chew Chew 2 tablets by mouth daily.      Follow-up Information    [facility] Follow up in 1 week(s).   Why:  1 week for BP check Contact information: [place] [phone]          Signed: Chancy Milroy 01/19/2018, 7:04 AM

## 2018-01-19 NOTE — Discharge Instructions (Signed)
Postpartum Hypertension °Postpartum hypertension is high blood pressure after pregnancy that remains higher than normal for more than two days after delivery. You may not realize that you have postpartum hypertension if your blood pressure is not being checked regularly. In some cases, postpartum hypertension will go away on its own, usually within a week of delivery. However, for some women, medical treatment is required to prevent serious complications, such as seizures or stroke. °The following things can affect your blood pressure: °· The type of delivery you had. °· Having received IV fluids or other medicines during or after delivery. ° °What are the causes? °Postpartum hypertension may be caused by any of the following or by a combination of any of the following: °· Hypertension that existed before pregnancy (chronic hypertension). °· Gestational hypertension. °· Preeclampsia or eclampsia. °· Receiving a lot of fluid through an IV during or after delivery. °· Medicines. °· HELLP syndrome. °· Hyperthyroidism. °· Stroke. °· Other rare neurological or blood disorders. ° °In some cases, the cause may not be known. °What increases the risk? °Postpartum hypertension can be related to one or more risk factors, such as: °· Chronic hypertension. In some cases, this may not have been diagnosed before pregnancy. °· Obesity. °· Type 2 diabetes. °· Kidney disease. °· Family history of preeclampsia. °· Other medical conditions that cause hormonal imbalances. ° °What are the signs or symptoms? °As with all types of hypertension, postpartum hypertension may not have any symptoms. Depending on how high your blood pressure is, you may experience: °· Headaches. These may be mild, moderate, or severe. They may also be steady, constant, or sudden in onset (thunderclap headache). °· Visual changes. °· Dizziness. °· Shortness of breath. °· Swelling of your hands, feet, lower legs, or face. In some cases, you may have swelling in  more than one of these locations. °· Heart palpitations or a racing heartbeat. °· Difficulty breathing while lying down. °· Decreased urination. ° °Other rare signs and symptoms may include: °· Sweating more than usual. This lasts longer than a few days after delivery. °· Chest pain. °· Sudden dizziness when you get up from sitting or lying down. °· Seizures. °· Nausea or vomiting. °· Abdominal pain. ° °How is this diagnosed? °The diagnosis of postpartum hypertension is made through a combination of physical examination findings and testing of your blood and urine. You may also have additional tests, such as a CT scan or an MRI, to check for other complications of postpartum hypertension. °How is this treated? °When blood pressure is high enough to require treatment, your options may include: °· Medicines to reduce blood pressure (antihypertensives). Tell your health care provider if you are breastfeeding or if you plan to breastfeed. There are many antihypertensive medicines that are safe to take while breastfeeding. °· Stopping medicines that may be causing hypertension. °· Treating medical conditions that are causing hypertension. °· Treating the complications of hypertension, such as seizures, stroke, or kidney problems. ° °Your health care provider will also continue to monitor your blood pressure closely and repeatedly until it is within a safe range for you. °Follow these instructions at home: °· Take medicines only as directed by your health care provider. °· Get regular exercise after your health care provider tells you that it is safe. °· Follow your health care provider’s recommendations on fluid and salt restrictions. °· Do not use any tobacco products, including cigarettes, chewing tobacco, or electronic cigarettes. If you need help quitting, ask your health care provider. °·   Keep all follow-up visits as directed by your health care provider. This is important. °Contact a health care provider  if: °· Your symptoms get worse. °· You have new symptoms, such as: °? Headache. °? Dizziness. °? Visual changes. °Get help right away if: °· You develop a severe or sudden headache. °· You have seizures. °· You develop numbness or weakness on one side of your body. °· You have difficulty thinking, speaking, or swallowing. °· You develop severe abdominal pain. °· You develop difficulty breathing, chest pain, a racing heartbeat, or heart palpitations. °These symptoms may represent a serious problem that is an emergency. Do not wait to see if the symptoms will go away. Get medical help right away. Call your local emergency services (911 in the [place] Do not drive yourself to the hospital. °This information is not intended to replace advice given to you by your health care provider. Make sure you discuss any questions you have with your health care provider. °Document Released: 07/09/2014 Document Revised: 04/09/2016 Document Reviewed: 05/20/2014 °Elsevier Interactive Patient Education © 2018 [employer] ° °

## 2018-01-27 ENCOUNTER — Ambulatory Visit: Payer: Medicaid Other

## 2018-10-18 IMAGING — CT CT ABD-PELV W/ CM
1 of 3 series · 14 of 32 positions shown, 18 images · IV contrast (iopamidol)
Comparison: Ultrasound 07/25/2017.

CLINICAL DATA: 22 weeks pregnant with severe RLQ pain for 4 days.
Elevated WBC. Evaluate for appendicitis. Consent form signed.

EXAM:
CT ABDOMEN AND PELVIS WITH CONTRAST
TECHNIQUE: Multidetector CT imaging of the abdomen and pelvis was performed
using the standard protocol following bolus administration of
intravenous contrast.
CONTRAST:  30mL [id number] IOPAMIDOL (ZIB799-7II) INJECTION 61%,
100mL ZIB799-7II IOPAMIDOL (ZIB799-7II) INJECTION 61%

[Series 3: (person_name) thins · axial · 0.71mm/px · z∈[+550,+935]mm · 14 of 619 slices shown, 18 images]
[im 46/619  soft-tissue]
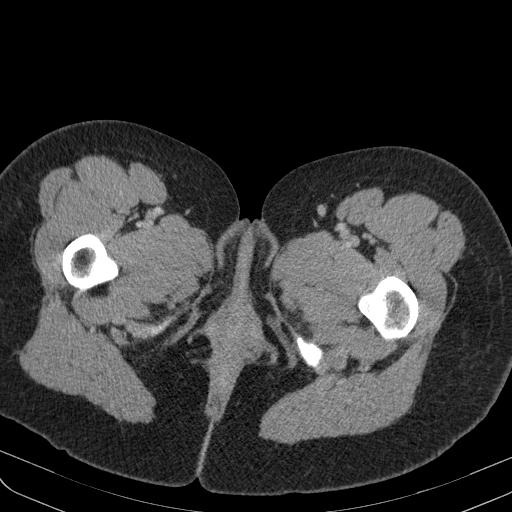
[im 46/619  bone]
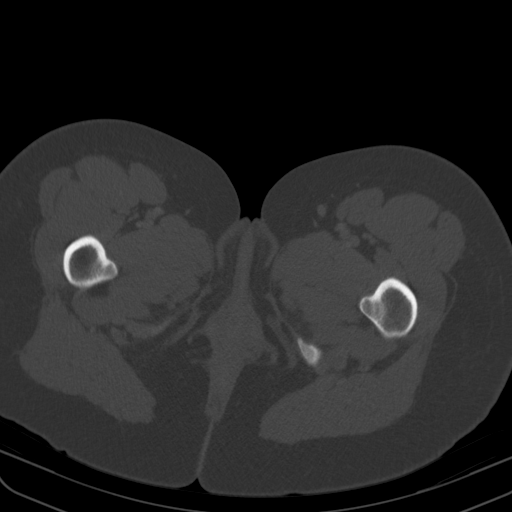
[im 92/619  soft-tissue]
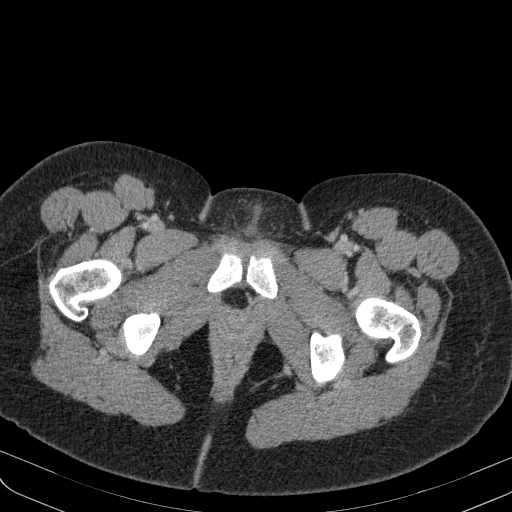
[im 138/619  soft-tissue]
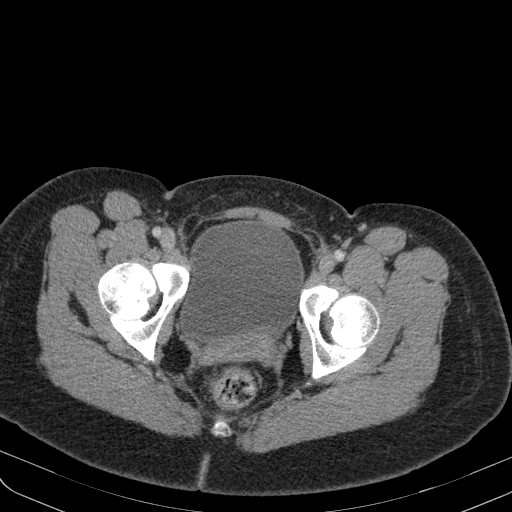
[im 184/619  soft-tissue]
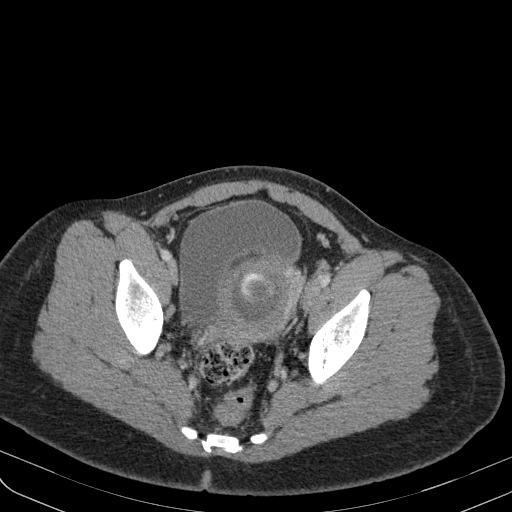
[im 229/619  soft-tissue]
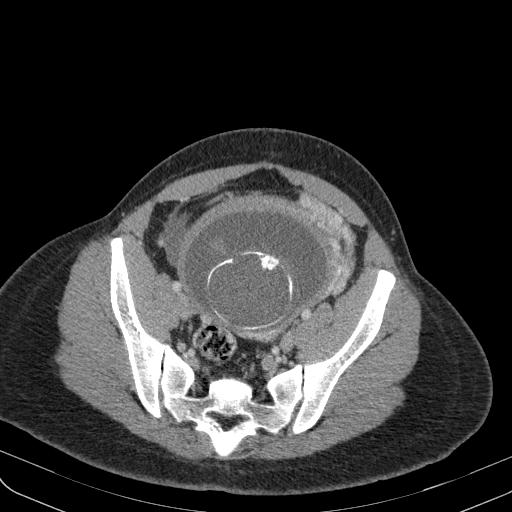
[im 275/619  soft-tissue]
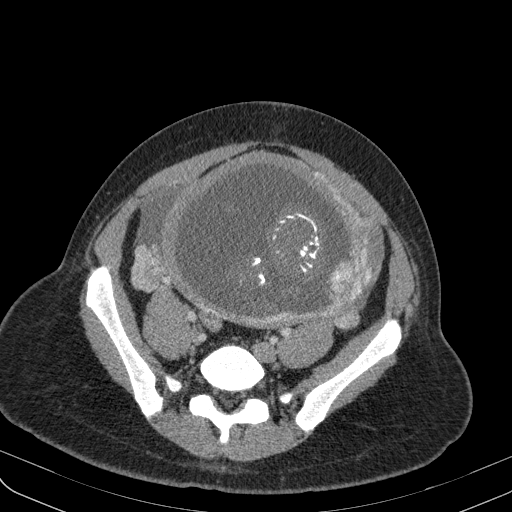
[im 344/619  soft-tissue]
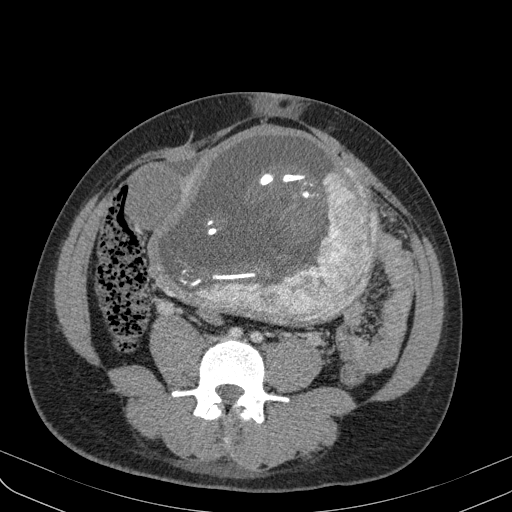
[im 390/619  soft-tissue]
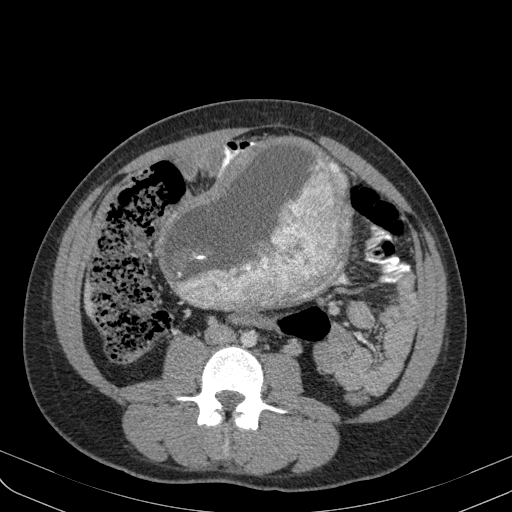
[im 435/619  soft-tissue]
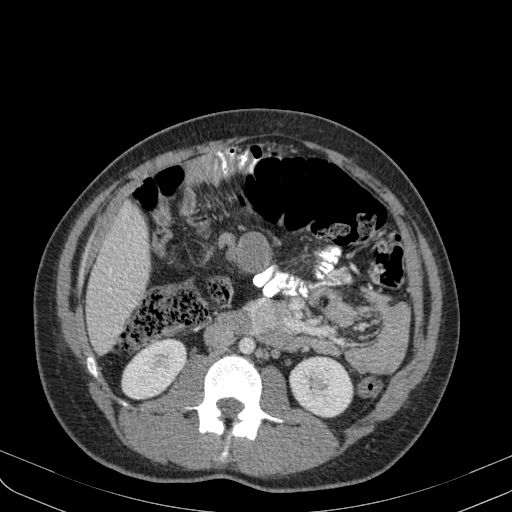
[im 435/619  bone]
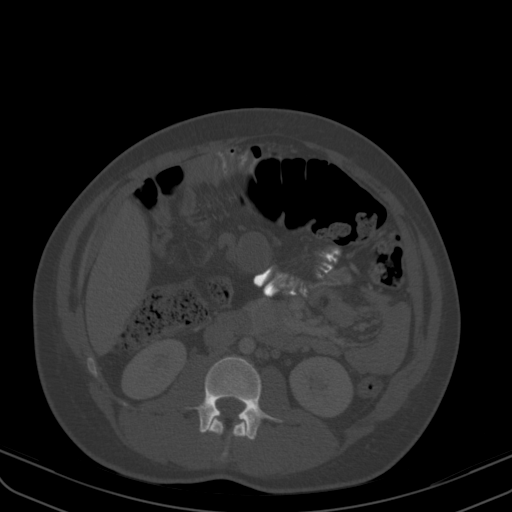
[im 481/619  soft-tissue]
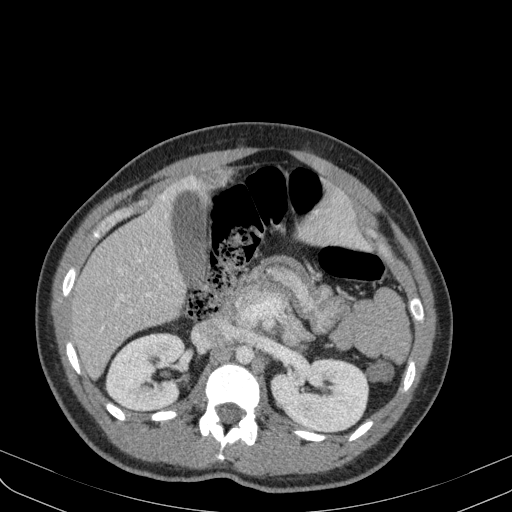
[im 527/619  soft-tissue]
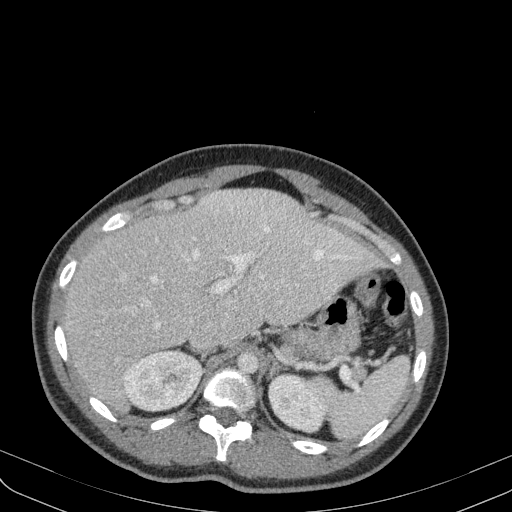
[im 527/619  lung]
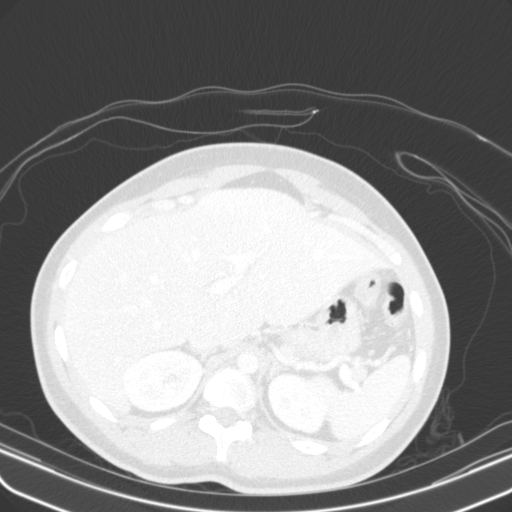
[im 550/619  lung]
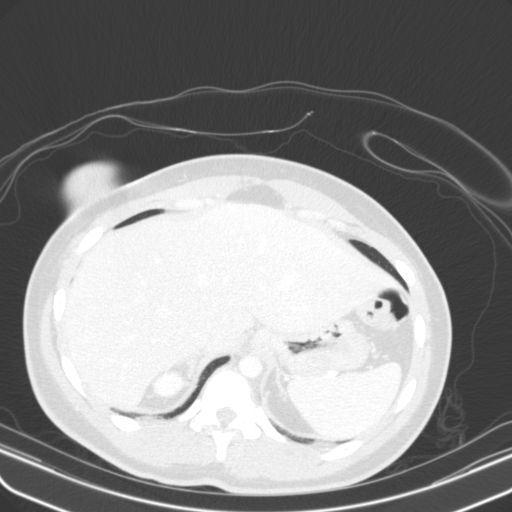
[im 573/619  soft-tissue]
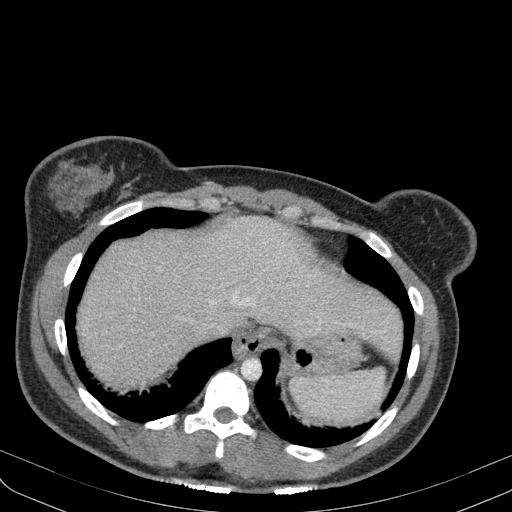
[im 573/619  lung]
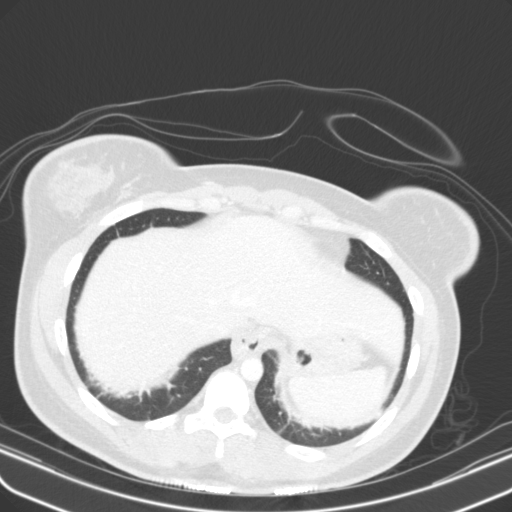
[im 596/619  lung]
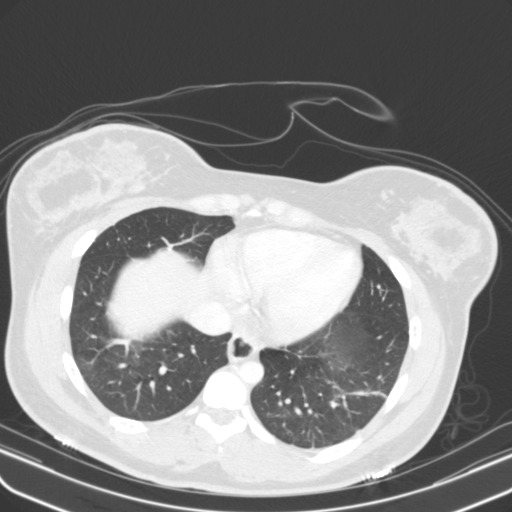

[14 of 32 positions shown; findings below may reference images not displayed]

FINDINGS: Lower chest: No acute abnormality.

Hepatobiliary: A small hypervascular lesion is identified within the
right hepatic lobe, statistically likely a benign hemangioma
measuring 11 mm. Differential diagnosis includes FNH or adenoma.
Gallbladder is present.

Pancreas: Unremarkable. No pancreatic ductal dilatation or
surrounding inflammatory changes.

Spleen: Normal in size without focal abnormality.

Adrenals/Urinary Tract: Adrenal glands are unremarkable. Kidneys are
normal, without renal calculi, focal lesion, or hydronephrosis.
Bladder is unremarkable.

Stomach/Bowel: The stomach and small bowel loops are normal in
appearance. No evidence for bowel obstruction. There is significant
stool throughout the colon. The appendix is not identified.

Vascular/Lymphatic: No significant vascular findings are present. No
enlarged abdominal or pelvic lymph nodes.

Reproductive: Gravid uterus. Fetus in cephalic presentation. Fundal
placenta. Along the right lateral uterine wall, there is a soft
tissue density mass measuring 5.0 x 4.2 cm and compatible with the
uterine fibroid. ( by previous ultrasound, fibroid measured 4.5 x
4.3 x 3.7 cm.) A similar rounded mass is identified at the fundus,
measuring 3.3 x 3.0 cm. The ovaries are normal in appearance.

Other: There is right lower quadrant fluid, contiguous with the
ovary, uterus, and right lateral fibroid.

Low density fluid, adjacent to the right lateral uterine fibroid.

Musculoskeletal: No acute or significant osseous findings.
IMPRESSION: 1. Right lateral uterine fibroid measuring 5.0 cm. There is fluid
surrounding this fibroid and the findings may indicate degenerating
fibroid as a source of the patient's pain. Consider ultrasound
correlation for this finding.
2. Although the appendix is not seen, no inflammatory changes are
identified in the region of the cecum to indicate acute
appendicitis.
3. Gravid uterus.  Cephalic presentation.
4. Statistically benign small hypervascular lesion in the right
hepatic lobe, likely representing hemangioma.
5. No hydronephrosis or evidence for urinary tract abnormality.
6. Large stool burden.
These results were called by telephone at the time of interpretation
on 09/27/2017 at [DATE] to Dr. DIMITRS SEVDALIS , who verbally
acknowledged these results.

## 2020-11-11 ENCOUNTER — Encounter (HOSPITAL_COMMUNITY): Payer: Self-pay | Admitting: Obstetrics and Gynecology

## 2020-11-11 ENCOUNTER — Inpatient Hospital Stay (HOSPITAL_COMMUNITY): Payer: No Typology Code available for payment source

## 2020-11-11 ENCOUNTER — Other Ambulatory Visit: Payer: Self-pay

## 2020-11-11 ENCOUNTER — Ambulatory Visit
Admission: EM | Admit: 2020-11-11 | Discharge: 2020-11-11 | Disposition: A | Discharge status: Home / Self Care | Payer: Medicaid Other | Attending: Emergency Medicine | Admitting: Emergency Medicine

## 2020-11-11 ENCOUNTER — Inpatient Hospital Stay (HOSPITAL_COMMUNITY)
Admission: AD | Admit: 2020-11-11 | Discharge: 2020-11-11 | Disposition: A | Discharge status: Home / Self Care | Payer: No Typology Code available for payment source | Attending: Obstetrics and Gynecology | Admitting: Obstetrics and Gynecology

## 2020-11-11 ENCOUNTER — Encounter: Payer: Self-pay | Admitting: Emergency Medicine

## 2020-11-11 DIAGNOSIS — Z3A01 Less than 8 weeks gestation of pregnancy: Secondary | ICD-10-CM | POA: Diagnosis not present

## 2020-11-11 DIAGNOSIS — O210 Mild hyperemesis gravidarum: Secondary | ICD-10-CM

## 2020-11-11 DIAGNOSIS — Z3490 Encounter for supervision of normal pregnancy, unspecified, unspecified trimester: Secondary | ICD-10-CM

## 2020-11-11 DIAGNOSIS — Z3201 Encounter for pregnancy test, result positive: Secondary | ICD-10-CM

## 2020-11-11 DIAGNOSIS — D259 Leiomyoma of uterus, unspecified: Secondary | ICD-10-CM | POA: Diagnosis not present

## 2020-11-11 DIAGNOSIS — O10911 Unspecified pre-existing hypertension complicating pregnancy, first trimester: Secondary | ICD-10-CM | POA: Insufficient documentation

## 2020-11-11 DIAGNOSIS — O3411 Maternal care for benign tumor of corpus uteri, first trimester: Secondary | ICD-10-CM

## 2020-11-11 DIAGNOSIS — O26899 Other specified pregnancy related conditions, unspecified trimester: Secondary | ICD-10-CM

## 2020-11-11 DIAGNOSIS — O99611 Diseases of the digestive system complicating pregnancy, first trimester: Secondary | ICD-10-CM

## 2020-11-11 DIAGNOSIS — O26891 Other specified pregnancy related conditions, first trimester: Secondary | ICD-10-CM

## 2020-11-11 DIAGNOSIS — R109 Unspecified abdominal pain: Secondary | ICD-10-CM | POA: Diagnosis not present

## 2020-11-11 DIAGNOSIS — K429 Umbilical hernia without obstruction or gangrene: Secondary | ICD-10-CM

## 2020-11-11 DIAGNOSIS — Z79899 Other long term (current) drug therapy: Secondary | ICD-10-CM | POA: Diagnosis not present

## 2020-11-11 DIAGNOSIS — Z6790 Unspecified blood type, Rh positive: Secondary | ICD-10-CM

## 2020-11-11 DIAGNOSIS — Z87891 Personal history of nicotine dependence: Secondary | ICD-10-CM | POA: Insufficient documentation

## 2020-11-11 HISTORY — DX: Essential (primary) hypertension: I10

## 2020-11-11 LAB — URINALYSIS, ROUTINE W REFLEX MICROSCOPIC
Bilirubin Urine: NEGATIVE
Glucose, UA: NEGATIVE mg/dL
Hgb urine dipstick: NEGATIVE
Ketones, ur: NEGATIVE mg/dL
Leukocytes,Ua: NEGATIVE
Nitrite: NEGATIVE
Protein, ur: NEGATIVE mg/dL
Specific Gravity, Urine: 1.031 — ABNORMAL HIGH (ref 1.005–1.030)
pH: 7 (ref 5.0–8.0)

## 2020-11-11 LAB — COMPREHENSIVE METABOLIC PANEL
ALT: 15 U/L (ref 0–44)
AST: 14 U/L — ABNORMAL LOW (ref 15–41)
Albumin: 3.6 g/dL (ref 3.5–5.0)
Alkaline Phosphatase: 38 U/L (ref 38–126)
Anion gap: 8 (ref 5–15)
BUN: 8 mg/dL (ref 6–20)
CO2: 22 mmol/L (ref 22–32)
Calcium: 9.2 mg/dL (ref 8.9–10.3)
Chloride: 105 mmol/L (ref 98–111)
Creatinine, Ser: 0.7 mg/dL (ref 0.44–1.00)
GFR, Estimated: >60 mL/min (ref >60)
Glucose, Bld: 99 mg/dL (ref 70–99)
Potassium: 4 mmol/L (ref 3.5–5.1)
Sodium: 135 mmol/L (ref 135–145)
Total Bilirubin: 0.5 mg/dL (ref 0.3–1.2)
Total Protein: 6.5 g/dL (ref 6.5–8.1)

## 2020-11-11 LAB — WET PREP, GENITAL
Sperm: NONE SEEN
Trich, Wet Prep: NONE SEEN
Yeast Wet Prep HPF POC: NONE SEEN

## 2020-11-11 LAB — CBC
HCT: 38.1 % (ref 36.0–46.0)
Hemoglobin: 12.6 g/dL (ref 12.0–15.0)
MCH: 30.8 pg (ref 26.0–34.0)
MCHC: 33.1 g/dL (ref 30.0–36.0)
MCV: 93.2 fL (ref 80.0–100.0)
Platelets: 253 10*3/uL (ref 150–400)
RBC: 4.09 MIL/uL (ref 3.87–5.11)
RDW: 12.5 % (ref 11.5–15.5)
WBC: 9.6 10*3/uL (ref 4.0–10.5)
nRBC: 0 % (ref 0.0–0.2)

## 2020-11-11 LAB — POCT URINE PREGNANCY: Preg Test, Ur: POSITIVE — AB

## 2020-11-11 LAB — HCG, QUANTITATIVE, PREGNANCY: hCG, Beta Chain, Quant, S: 55919 m[IU]/mL — ABNORMAL HIGH (ref <5)

## 2020-11-11 MED ORDER — DOXYLAMINE-PYRIDOXINE 10-10 MG PO TBEC: 2.0000 | DELAYED_RELEASE_TABLET | Freq: Every evening | ORAL | 0 refills | Status: DC | PRN

## 2020-11-11 NOTE — Discharge Instructions (Signed)
Diclegis: Two tablets at bedtime on day 1 and 2; if symptoms persist, take 1 tablet in morning and 2 tablets at bedtime on day 3; if symptoms persist, may increase to 1 tablet in morning, 1 tablet mid-afternoon, and 2 tablets at bedtime on day 4 (maximum: doxylamine 40 mg/pyridoxine 40 mg (4 tablets) per day). OR One-half of the 25 mg Unisom sleep tablet over-the-counter tablet or two chewable 5 mg tablets can be used off-label as an antiemetic. In addition, pyridoxine 25 mg, also available over-the-counter, is taken three or four times per day;This is a reasonable, less expensive substitute for combination tablets.  Follow up with OBGYN  Go to [facility]'s [facility] for evaluation of abdmoinal pain- entrance C at main [facility] hospital

## 2020-11-11 NOTE — Discharge Instructions (Signed)
Safe Medications in Pregnancy    Acne: Benzoyl Peroxide Salicylic Acid  Backache/Headache: Tylenol: 2 regular strength every 4 hours OR              2 Extra strength every 6 hours  Colds/Coughs/Allergies: Benadryl (alcohol free) 25 mg every 6 hours as needed Breath right strips Claritin Cepacol throat lozenges Chloraseptic throat spray Cold-Eeze- up to three times per day Cough drops, alcohol free Flonase (by prescription only) Guaifenesin Mucinex Robitussin DM (plain only, alcohol free) Saline nasal spray/drops Sudafed (pseudoephedrine) & Actifed ** use only after [redacted] weeks gestation and if you do not have high blood pressure Tylenol Vicks Vaporub Zinc lozenges Zyrtec   Constipation: Colace Ducolax suppositories Fleet enema Glycerin suppositories Metamucil Milk of magnesia Miralax Senokot Smooth move tea  Diarrhea: Kaopectate Imodium A-D  *NO pepto Bismol  Hemorrhoids: Anusol Anusol HC Preparation H Tucks  Indigestion: Tums Maalox Mylanta Zantac  Pepcid  Insomnia: Benadryl (alcohol free) 25mg  every 6 hours as needed Tylenol PM Unisom, no Gelcaps  Leg Cramps: Tums MagGel  Nausea/Vomiting:  Bonine Dramamine Emetrol Ginger extract Sea bands Meclizine  Nausea medication to take during pregnancy:  Unisom (doxylamine succinate 25 mg tablets) Take one tablet daily at bedtime. If symptoms are not adequately controlled, the dose can be increased to a maximum recommended dose of two tablets daily (1/2 tablet in the morning, 1/2 tablet mid-afternoon and one at bedtime). Vitamin B6 100mg  tablets. Take one tablet twice a day (up to 200 mg per day).  Skin Rashes: Aveeno products Benadryl cream or 25mg  every 6 hours as needed Calamine Lotion 1% cortisone cream  Yeast infection: Gyne-lotrimin 7 Monistat 7   **If taking multiple medications, please check labels to avoid duplicating the same active ingredients **take  medication as directed on the label ** Do not exceed 4000 mg of tylenol in 24 hours **Do not take medications that contain aspirin or ibuprofen          [facility] [place] for [place] @ [facility] - accepts patients without insurance  Phone: [place] for [employer] @ [place]   Phone: [place] @[facility]       Phone: [place] for [place] @ [place]     Phone: [place] for [place] @ [facility]   Phone: [place] for [place] @ [facility] - accepts patients without insurance  Phone: [place] for [place] [facility] Phone: [place] [facility] - accepts patients without insurance Phone: [facility]  Phone: [facility] Phone: [phone]  [facility] Phone: [phone]  [facility] Phone: [phone]  [facility] Phone: [phone]  [facility]  Phone: [phone]         Hernia, Adult     A hernia is the bulging of an organ or tissue through a weak spot in the muscles of the abdomen (abdominal wall). Hernias develop most often near the belly button (navel) or the area where the leg meets the lower abdomen (groin). Common types of hernias include:  Incisional hernia. This type bulges through a scar from an abdominal surgery.  Umbilical hernia. This type develops near the navel.  Inguinal  hernia. This type develops in the groin or scrotum.  Femoral hernia. This type develops under the groin, in the upper thigh area.  Hiatal hernia. This type occurs when part of the stomach slides above the muscle that separates the abdomen from the chest (diaphragm). What are the causes? This condition may be caused by:  Heavy  lifting.  Coughing over a long period of time.  Straining to have a bowel movement. Constipation can lead to straining.  An incision made during an abdominal surgery.  A physical problem that is present at birth (congenital defect).  Being overweight or obese.  Smoking.  Excess fluid in the abdomen.  Undescended testicles in males. What are the signs or symptoms? The main symptom is a skin-colored, rounded bulge in the area of the hernia. However, a bulge may not always be present. It may grow bigger or be more visible when you cough or strain (such as when lifting something heavy). A hernia that can be pushed back into the area (is reducible) rarely causes pain. A hernia that cannot be pushed back into the area (is incarcerated) may lose its blood supply (become strangulated). A hernia that is incarcerated may cause:  Pain.  Fever.  Nausea and vomiting.  Swelling.  Constipation. How is this diagnosed? A hernia may be diagnosed based on:  Your symptoms and medical history.  A physical exam. Your health care provider may ask you to cough or move in certain ways to see if the hernia becomes visible.  Imaging tests, such as: ? X-rays. ? Ultrasound. ? CT scan. How is this treated? A hernia that is small and painless may not need to be treated. A hernia that is large or painful may be treated with surgery. Inguinal hernias may be treated with surgery to prevent incarceration or strangulation. Strangulated hernias are always treated with surgery because a lack of blood supply to the trapped organ or tissue can cause it to die. Surgery to treat a hernia involves pushing the bulge back into place and repairing the weak area of the muscle or abdominal wall. Follow these instructions at home: Activity  Avoid straining.  Do not lift anything that is heavier than 10 lb (4.5 kg), or the limit that you are told, until your health care provider says that it is safe.  When lifting  heavy objects, lift with your leg muscles, not your back muscles. Preventing constipation  Take actions to prevent constipation. Constipation leads to straining with bowel movements, which can make a hernia worse or cause a hernia repair to break down. Your health care provider may recommend that you: ? Drink enough fluid to keep your urine pale yellow. ? Eat foods that are high in fiber, such as fresh fruits and vegetables, whole grains, and beans. ? Limit foods that are high in fat and processed sugars, such as fried or sweet foods. ? Take an over-the-counter or prescription medicine for constipation. General instructions  When coughing, try to cough gently.  You may try to push the hernia back in place by very gently pressing on it while lying down. Do not try to force the bulge back in if it will not push in easily.  If you are overweight, work with your health care provider to lose weight safely.  Do not use any products that contain nicotine or tobacco, such as cigarettes and e-cigarettes. If you need help quitting, ask your health care provider.  If you are scheduled for hernia repair, watch your  hernia for any changes in shape, size, or color. Tell your health care provider about any changes or new symptoms.  Take over-the-counter and prescription medicines only as told by your health care provider.  Keep all follow-up visits as told by your health care provider. This is important. Contact a health care provider if:  You develop new pain, swelling, or redness around your hernia.  You have signs of constipation, such as: ? Fewer bowel movements in a week than normal. ? Difficulty having a bowel movement. ? Stools that are dry, hard, or larger than normal. Get help right away if:  You have a fever.  You have abdomen pain that gets worse.  You feel nauseous or you vomit.  You cannot push the hernia back in place by very gently pressing on it while lying down. Do not try to  force the bulge back in if it will not push in easily.  The hernia: ? Changes in shape, size, or color. ? Feels hard or tender. These symptoms may represent a serious problem that is an emergency. Do not wait to see if the symptoms will go away. Get medical help right away. Call your local emergency services (911 in the [place] Summary  A hernia is the bulging of an organ or tissue through a weak spot in the muscles of the abdomen (abdominal wall).  The main symptom is a skin-colored, rounded lump (bulge) in the hernia area. However, a bulge may not always be present. It may grow bigger or more visible when you cough or strain (such as when having a bowel movement).  A hernia that is small and painless may not need to be treated. A hernia that is large or painful may be treated with surgery.  Surgery to treat a hernia involves pushing the bulge back into place and repairing the weak part of the abdomen. This information is not intended to replace advice given to you by your health care provider. Make sure you discuss any questions you have with your health care provider. Document Revised: 02/26/2019 Document Reviewed: 08/07/2017 Elsevier Patient Education  [place] of Pregnancy The first trimester of pregnancy is from week 1 until the end of week 13 (months 1 through 3). A week after a sperm fertilizes an egg, the egg will implant on the wall of the uterus. This embryo will begin to develop into a baby. Genes from you and your partner will form the baby. The female genes will determine whether the baby will be a boy or a girl. At 6-8 weeks, the eyes and face will be formed, and the heartbeat can be seen on ultrasound. At the end of 12 weeks, all the baby's organs will be formed. Now that you are pregnant, you will want to do everything you can to have a healthy baby. Two of the most important things are to get good prenatal care and to follow your health care  provider's instructions. Prenatal care is all the medical care you receive before the baby's birth. This care will help prevent, find, and treat any problems during the pregnancy and childbirth. Body changes during your first trimester Your body goes through many changes during pregnancy. The changes vary from woman to woman.  You may gain or lose a couple of pounds at first.  You may feel sick to your stomach (nauseous) and you may throw up (vomit). If the vomiting is uncontrollable, call your health  care provider.  You may tire easily.  You may develop headaches that can be relieved by medicines. All medicines should be approved by your health care provider.  You may urinate more often. Painful urination may mean you have a bladder infection.  You may develop heartburn as a result of your pregnancy.  You may develop constipation because certain hormones are causing the muscles that push stool through your intestines to slow down.  You may develop hemorrhoids or swollen veins (varicose veins).  Your breasts may begin to grow larger and become tender. Your nipples may stick out more, and the tissue that surrounds them (areola) may become darker.  Your gums may bleed and may be sensitive to brushing and flossing.  Dark spots or blotches (chloasma, mask of pregnancy) may develop on your face. This will likely fade after the baby is born.  Your menstrual periods will stop.  You may have a loss of appetite.  You may develop cravings for certain kinds of food.  You may have changes in your emotions from day to day, such as being excited to be pregnant or being concerned that something may go wrong with the pregnancy and baby.  You may have more vivid and strange dreams.  You may have changes in your hair. These can include thickening of your hair, rapid growth, and changes in texture. Some women also have hair loss during or after pregnancy, or hair that feels dry or thin. Your hair  will most likely return to normal after your baby is born. What to expect at prenatal visits During a routine prenatal visit:  You will be weighed to make sure you and the baby are growing normally.  Your blood pressure will be taken.  Your abdomen will be measured to track your baby's growth.  The fetal heartbeat will be listened to between weeks 10 and 14 of your pregnancy.  Test results from any previous visits will be discussed. Your health care provider may ask you:  How you are feeling.  If you are feeling the baby move.  If you have had any abnormal symptoms, such as leaking fluid, bleeding, severe headaches, or abdominal cramping.  If you are using any tobacco products, including cigarettes, chewing tobacco, and electronic cigarettes.  If you have any questions. Other tests that may be performed during your first trimester include:  Blood tests to find your blood type and to check for the presence of any previous infections. The tests will also be used to check for low iron levels (anemia) and protein on red blood cells (Rh antibodies). Depending on your risk factors, or if you previously had diabetes during pregnancy, you may have tests to check for high blood sugar that affects pregnant women (gestational diabetes).  Urine tests to check for infections, diabetes, or protein in the urine.  An ultrasound to confirm the proper growth and development of the baby.  Fetal screens for spinal cord problems (spina bifida) and Down syndrome.  HIV (human immunodeficiency virus) testing. Routine prenatal testing includes screening for HIV, unless you choose not to have this test.  You may need other tests to make sure you and the baby are doing well. Follow these instructions at home: Medicines  Follow your health care provider's instructions regarding medicine use. Specific medicines may be either safe or unsafe to take during pregnancy.  Take a prenatal vitamin that contains  at least 600 micrograms (mcg) of folic acid.  If you develop constipation, try taking a  stool softener if your health care provider approves. Eating and drinking   Eat a balanced diet that includes fresh fruits and vegetables, whole grains, good sources of protein such as meat, eggs, or tofu, and low-fat dairy. Your health care provider will help you determine the amount of weight gain that is right for you.  Avoid raw meat and uncooked cheese. These carry germs that can cause birth defects in the baby.  Eating four or five small meals rather than three large meals a day may help relieve nausea and vomiting. If you start to feel nauseous, eating a few soda crackers can be helpful. Drinking liquids between meals, instead of during meals, also seems to help ease nausea and vomiting.  Limit foods that are high in fat and processed sugars, such as fried and sweet foods.  To prevent constipation: ? Eat foods that are high in fiber, such as fresh fruits and vegetables, whole grains, and beans. ? Drink enough fluid to keep your urine clear or pale yellow. Activity  Exercise only as directed by your health care provider. Most women can continue their usual exercise routine during pregnancy. Try to exercise for 30 minutes at least 5 days a week. Exercising will help you: ? Control your weight. ? Stay in shape. ? Be prepared for labor and delivery.  Experiencing pain or cramping in the lower abdomen or lower back is a good sign that you should stop exercising. Check with your health care provider before continuing with normal exercises.  Try to avoid standing for long periods of time. Move your legs often if you must stand in one place for a long time.  Avoid heavy lifting.  Wear low-heeled shoes and practice good posture.  You may continue to have sex unless your health care provider tells you not to. Relieving pain and discomfort  Wear a good support bra to relieve breast  tenderness.  Take warm sitz baths to soothe any pain or discomfort caused by hemorrhoids. Use hemorrhoid cream if your health care provider approves.  Rest with your legs elevated if you have leg cramps or low back pain.  If you develop varicose veins in your legs, wear support hose. Elevate your feet for 15 minutes, 3-4 times a day. Limit salt in your diet. Prenatal care  Schedule your prenatal visits by the twelfth week of pregnancy. They are usually scheduled monthly at first, then more often in the last 2 months before delivery.  Write down your questions. Take them to your prenatal visits.  Keep all your prenatal visits as told by your health care provider. This is important. Safety  Wear your seat belt at all times when driving.  Make a list of emergency phone numbers, including numbers for family, friends, the hospital, and police and fire departments. General instructions  Ask your health care provider for a referral to a local prenatal education class. Begin classes no later than the beginning of month 6 of your pregnancy.  Ask for help if you have counseling or nutritional needs during pregnancy. Your health care provider can offer advice or refer you to specialists for help with various needs.  Do not use hot tubs, steam rooms, or saunas.  Do not douche or use tampons or scented sanitary pads.  Do not cross your legs for long periods of time.  Avoid cat litter boxes and soil used by cats. These carry germs that can cause birth defects in the baby and possibly loss of the fetus by  miscarriage or stillbirth.  Avoid all smoking, herbs, alcohol, and medicines not prescribed by your health care provider. Chemicals in these products affect the formation and growth of the baby.  Do not use any products that contain nicotine or tobacco, such as cigarettes and e-cigarettes. If you need help quitting, ask your health care provider. You may receive counseling support and other  resources to help you quit.  Schedule a dentist appointment. At home, brush your teeth with a soft toothbrush and be gentle when you floss. Contact a health care provider if:  You have dizziness.  You have mild pelvic cramps, pelvic pressure, or nagging pain in the abdominal area.  You have persistent nausea, vomiting, or diarrhea.  You have a bad smelling vaginal discharge.  You have pain when you urinate.  You notice increased swelling in your face, hands, legs, or ankles.  You are exposed to fifth disease or chickenpox.  You are exposed to [place] measles (rubella) and have never had it. Get help right away if:  You have a fever.  You are leaking fluid from your vagina.  You have spotting or bleeding from your vagina.  You have severe abdominal cramping or pain.  You have rapid weight gain or loss.  You vomit blood or material that looks like coffee grounds.  You develop a severe headache.  You have shortness of breath.  You have any kind of trauma, such as from a fall or a car accident. Summary  The first trimester of pregnancy is from week 1 until the end of week 13 (months 1 through 3).  Your body goes through many changes during pregnancy. The changes vary from woman to woman.  You will have routine prenatal visits. During those visits, your health care provider will examine you, discuss any test results you may have, and talk with you about how you are feeling. This information is not intended to replace advice given to you by your health care provider. Make sure you discuss any questions you have with your health care provider. Document Revised: 10/18/2017 Document Reviewed: 10/17/2016 Elsevier Patient Education  2020 [employer].

## 2020-11-11 NOTE — MAU Provider Note (Signed)
History     CSN: [id number]  Arrival date and time: 11/11/20 1041   Event Date/Time   First Provider Initiated Contact with Patient 11/11/20 1146      No chief complaint on file.  Janet Pollard is a 29 y.o. G2P1001 at [redacted]w[redacted]d who presents to MAU for abdominal pain and evaluation of an umbilical hernia. Patient was seen in Urgent Care and had her hernia evaluated there but was also sent to MAU for evaluation. Patient reports the hernia is reducible at home and comes out when she strains. Patient reports it most recently popped out when she sneezed this morning. Patient reports abdominal pain localized to umbilicus.  Pt denies VB, vaginal discharge/odor/itching. Pt denies N/V, constipation, diarrhea, or urinary problems. Pt denies fever, chills, fatigue, sweating or changes in appetite. Pt denies SOB or chest pain. Pt denies dizziness, HA, light-headedness, weakness.   OB History    Gravida  2   Para  1   Term  1   Preterm      AB      Living  1     SAB      IAB      Ectopic      Multiple  0   Live Births  1           Past Medical History:  Diagnosis Date  . History of stomach ulcers 2011  . Hypertension   . Ovarian cyst 2011    Past Surgical History:  Procedure Laterality Date  . WISDOM TOOTH EXTRACTION      Family History  Problem Relation Age of Onset  . Diabetes Father     Social History   Tobacco Use  . Smoking status: Former Smoker    Packs/day: 0.25    Types: Cigarettes    Quit date: 06/08/2017    Years since quitting: 3.4  . Smokeless tobacco: Never Used  Substance Use Topics  . Alcohol use: No  . Drug use: No    Allergies: No Known Allergies  Medications Prior to Admission  Medication Sig Dispense Refill Last Dose  . amLODipine (NORVASC) 5 MG tablet Take 1 tablet (5 mg total) by mouth daily. (Patient not taking: Reported on 11/11/2020) 30 tablet 1   . Doxylamine-Pyridoxine 10-10 MG TBEC Take 2 tablets by mouth at bedtime  as needed. 60 tablet 0   . ibuprofen (ADVIL,MOTRIN) 600 MG tablet Take 1 tablet (600 mg total) by mouth every 6 (six) hours as needed for moderate pain. 30 tablet 0   . Prenatal MV & Min w/FA-DHA (PRENATAL ADULT GUMMY/DHA/FA) 0.4-25 MG CHEW Chew 2 tablets by mouth daily.       Review of Systems  Constitutional: Negative for chills, diaphoresis, fatigue and fever.  Eyes: Negative for visual disturbance.  Respiratory: Negative for shortness of breath.   Cardiovascular: Negative for chest pain.  Gastrointestinal: Positive for abdominal pain. Negative for constipation, diarrhea, nausea and vomiting.  Genitourinary: Negative for dysuria, flank pain, frequency, pelvic pain, urgency, vaginal bleeding and vaginal discharge.  Neurological: Negative for dizziness, weakness, light-headedness and headaches.   Physical Exam   Blood pressure 119/66, pulse 77, temperature 98.8 F (37.1 C), temperature source Oral, resp. rate 16, height 5\' 2"  (1.575 m), weight 79 kg, last menstrual period 08/25/2020, SpO2 100 %, unknown if currently breastfeeding.  Patient Vitals for the past 24 hrs:  BP Temp Temp src Pulse Resp SpO2 Height Weight  11/11/20 1057 119/66 98.8 F (37.1 C) Oral 77 16  100 % 5\' 2"  (1.575 m) 79 kg   Physical Exam Constitutional:      General: She is not in acute distress.    Appearance: She is well-developed and well-nourished. She is not diaphoretic.  HENT:     Head: Normocephalic and atraumatic.  Pulmonary:     Effort: Pulmonary effort is normal.  Abdominal:     General: There is no distension.     Palpations: Abdomen is soft. There is no mass.     Tenderness: There is no abdominal tenderness. There is no guarding or rebound.     Hernia: A hernia (umbilical hernia visualized and palpated, on examination, hernia incidentally reduced with minimal pressure, no evidence of incarceration) is present.  Skin:    General: Skin is warm and dry.  Neurological:     Mental Status: She is  alert and oriented to person, place, and time.  Psychiatric:        Mood and Affect: Mood and affect normal.        Behavior: Behavior normal.        Thought Content: Thought content normal.        Judgment: Judgment normal.    Results for orders placed or performed during the hospital encounter of 11/11/20 (from the past 24 hour(s))  Urinalysis, Routine w reflex microscopic Urine, Clean Catch     Status: Abnormal   Collection Time: 11/11/20 11:18 AM  Result Value Ref Range   Color, Urine YELLOW YELLOW   APPearance HAZY (A) CLEAR   Specific Gravity, Urine 1.031 (H) 1.005 - 1.030   pH 7.0 5.0 - 8.0   Glucose, UA NEGATIVE NEGATIVE mg/dL   Hgb urine dipstick NEGATIVE NEGATIVE   Bilirubin Urine NEGATIVE NEGATIVE   Ketones, ur NEGATIVE NEGATIVE mg/dL   Protein, ur NEGATIVE NEGATIVE mg/dL   Nitrite NEGATIVE NEGATIVE   Leukocytes,Ua NEGATIVE NEGATIVE  CBC     Status: None   Collection Time: 11/11/20 12:05 PM  Result Value Ref Range   WBC 9.6 4.0 - 10.5 K/uL   RBC 4.09 3.87 - 5.11 MIL/uL   Hemoglobin 12.6 12.0 - 15.0 g/dL   HCT 38.1 36.0 - 46.0 %   MCV 93.2 80.0 - 100.0 fL   MCH 30.8 26.0 - 34.0 pg   MCHC 33.1 30.0 - 36.0 g/dL   RDW 12.5 11.5 - 15.5 %   Platelets 253 150 - 400 K/uL   nRBC 0.0 0.0 - 0.2 %  Comprehensive metabolic panel     Status: Abnormal   Collection Time: 11/11/20 12:05 PM  Result Value Ref Range   Sodium 135 135 - 145 mmol/L   Potassium 4.0 3.5 - 5.1 mmol/L   Chloride 105 98 - 111 mmol/L   CO2 22 22 - 32 mmol/L   Glucose, Bld 99 70 - 99 mg/dL   BUN 8 6 - 20 mg/dL   Creatinine, Ser 0.70 0.44 - 1.00 mg/dL   Calcium 9.2 8.9 - 10.3 mg/dL   Total Protein 6.5 6.5 - 8.1 g/dL   Albumin 3.6 3.5 - 5.0 g/dL   AST 14 (L) 15 - 41 U/L   ALT 15 0 - 44 U/L   Alkaline Phosphatase 38 38 - 126 U/L   Total Bilirubin 0.5 0.3 - 1.2 mg/dL   GFR, Estimated >60 >60 mL/min   Anion gap 8 5 - 15  hCG, quantitative, pregnancy     Status: Abnormal   Collection Time: 11/11/20  12:05 PM  Result Value Ref Range  hCG, Beta Chain, Quant, S 55,919 (H) <5 mIU/mL  Wet prep, genital     Status: Abnormal   Collection Time: 11/11/20 12:37 PM  Result Value Ref Range   Yeast Wet Prep HPF POC NONE SEEN NONE SEEN   Trich, Wet Prep NONE SEEN NONE SEEN   Clue Cells Wet Prep HPF POC PRESENT (A) NONE SEEN   WBC, Wet Prep HPF POC MANY (A) NONE SEEN   Sperm NONE SEEN    US OB LESS THAN 14 WEEKS WITH OB TRANSVAGINAL  Result Date: 11/11/2020 CLINICAL DATA:  Pregnant, abdominal pain EXAM: OBSTETRIC <14 WK ULTRASOUND TECHNIQUE: Transabdominal ultrasound was performed for evaluation of the gestation as well as the maternal uterus and adnexal regions. COMPARISON:  None. FINDINGS: Intrauterine gestational sac: Single Yolk sac:  Visualized. Embryo:  Visualized. Cardiac Activity: Visualized. Heart Rate: 137 bpm CRL:   10.5 mm   7 w 1 d                  [place] EDC: 06/29/2021. Subchorionic hemorrhage:  None visualized. Maternal uterus/adnexae: Corpus luteum cyst noted in the right ovary. Normal left ovary. Calcified fibroid of the right uterine fundus measuring 3.5 cm. IMPRESSION: Single intrauterine gestation at sonographic gestational age of [redacted] weeks, 1 day. EDD 06/29/2021. Fetal heart rate 137 BPM. Electronically Signed   By: Eddie Candle M.D.   On: 11/11/2020 14:16    MAU Course  Procedures  MDM -r/o ectopic -UA: hazy/SG 1.031 -CBC: WNL -CMP: WNL -[place]: single IUP, [redacted]w[redacted]d, FHR 137, CL right ovary, calcified fibroid right uterine fundus 3.5cm -hCG: 62,952 -ABO: B Positive -WetPrep: +ClueCells (isolated finding not requiring treatment) -GC/CT collected -pt discharged to home in stable condition  Orders Placed This Encounter  Procedures  . Wet prep, genital    Standing Status:   Standing    Number of Occurrences:   1  . US OB LESS THAN 14 WEEKS WITH OB TRANSVAGINAL    Standing Status:   Standing    Number of Occurrences:   1    Order Specific Question:   Symptom/Reason for Exam     Answer:   Abdominal pain in pregnancy [[id number]]  . Urinalysis, Routine w reflex microscopic Urine, Clean Catch    Standing Status:   Standing    Number of Occurrences:   1  . CBC    Standing Status:   Standing    Number of Occurrences:   1  . Comprehensive metabolic panel    Standing Status:   Standing    Number of Occurrences:   1  . hCG, quantitative, pregnancy    Standing Status:   Standing    Number of Occurrences:   1  . Ambulatory referral to Gastroenterology    Referral Priority:   Routine    Referral Type:   Consultation    Referral Reason:   Specialty Services Required    Number of Visits Requested:   1  . Discharge patient    Order Specific Question:   Discharge disposition    Answer:   01-Home or Self Care [1]    Order Specific Question:   Discharge patient date    Answer:   11/11/2020   No orders of the defined types were placed in this encounter.  Assessment and Plan   1. Umbilical hernia without obstruction and without gangrene   2. Abdominal pain in pregnancy   3. Uterine leiomyoma, unspecified location   4. [redacted] weeks gestation of pregnancy  5. Blood type, Rh positive   6. Intrauterine pregnancy     Allergies as of 11/11/2020   No Known Allergies     Medication List    STOP taking these medications   ibuprofen 600 MG tablet Commonly known as: ADVIL     TAKE these medications   amLODipine 5 MG tablet Commonly known as: NORVASC Take 1 tablet (5 mg total) by mouth daily.   Doxylamine-Pyridoxine 10-10 MG Tbec Take 2 tablets by mouth at bedtime as needed.   Prenatal Adult Gummy/DHA/FA 0.4-25 MG Chew Chew 2 tablets by mouth daily.       -referral to GI for hernia (discussed with patient they will likely refrain from any surgical intervention unless necessary until after pregnancy) -safe meds in pregnancy list given -list of OB providers given, pt to start [facility] -return MAU precautions given -pt discharged to home in stable condition  Joni Reining E  Jordy Hewins 11/11/2020, 3:17 PM

## 2020-11-11 NOTE — ED Provider Notes (Signed)
[facility]    CSN: [id number] Arrival date & time: 11/11/20  0850      History   Chief Complaint Chief Complaint  Patient presents with  . Possible Pregnancy    HPI Janet Pollard is a 29 y.o. female presenting today for pregnancy test.  Reports recently has had increased morning sickness and nausea.  Reports last known menstrual cycle was October 2021, unsure of exact date.  Has had prior vaginal delivery.  Reports pups with prior pregnancy.  Had at home test which was positive.  Plans to follow-up with OB/GYN in January 2022.  She does report she has noticed some increased discomfort around her umbilicus and has felt firmer than normal.  Believes she does have a hernia in this area, worsens with straining.  HPI  Past Medical History:  Diagnosis Date  . History of stomach ulcers 2011  . Ovarian cyst 2011    Patient Active Problem List   Diagnosis Date Noted  . Hypertension in pregnancy, preeclampsia, severe, delivered/postpartum 01/17/2018  . Cholestasis during pregnancy in third trimester 01/09/2018  . Uterine fibroid complicating antenatal care, baby not yet delivered in second trimester 09/27/2017  . Pelvic pain affecting pregnancy in second trimester, antepartum 09/27/2017    Past Surgical History:  Procedure Laterality Date  . WISDOM TOOTH EXTRACTION      OB History    Gravida  2   Para  1   Term  1   Preterm      AB      Living  1     SAB      IAB      Ectopic      Multiple  0   Live Births  1            Home Medications    Prior to Admission medications   Medication Sig Start Date End Date Taking? Authorizing Provider  amLODipine (NORVASC) 5 MG tablet Take 1 tablet (5 mg total) by mouth daily. Patient not taking: Reported on 11/11/2020 01/19/18   Hermina StaggersErvin, Michael L, MD  Doxylamine-Pyridoxine 10-10 MG TBEC Take 2 tablets by mouth at bedtime as needed. 11/11/20   Sommer Spickard C, PA-C  ibuprofen (ADVIL,MOTRIN) 600 MG  tablet Take 1 tablet (600 mg total) by mouth every 6 (six) hours as needed for moderate pain. 01/19/18   Hermina StaggersErvin, Michael L, MD  Prenatal MV & Min w/FA-DHA (PRENATAL ADULT GUMMY/DHA/FA) 0.4-25 MG CHEW Chew 2 tablets by mouth daily.    [provider]    Family History Family History  Problem Relation Age of Onset  . Diabetes Father     Social History Social History   Tobacco Use  . Smoking status: Former Smoker    Packs/day: 0.25    Types: Cigarettes    Quit date: 06/08/2017    Years since quitting: 3.4  . Smokeless tobacco: Never Used  Substance Use Topics  . Alcohol use: No  . Drug use: No     Allergies   Patient has no known allergies.   Review of Systems Review of Systems  Constitutional: Negative for fever.  Respiratory: Negative for shortness of breath.   Cardiovascular: Negative for chest pain.  Gastrointestinal: Negative for abdominal pain, diarrhea, nausea and vomiting.  Genitourinary: Negative for dysuria, flank pain, genital sores, hematuria, menstrual problem, vaginal bleeding, vaginal discharge and vaginal pain.  Musculoskeletal: Negative for back pain.  Skin: Negative for rash.  Neurological: Negative for dizziness, light-headedness and headaches.  Physical Exam Triage Vital Signs ED Triage Vitals  Enc Vitals Group     BP      Pulse      Resp      Temp      Temp src      SpO2      Weight      Height      Head Circumference      Peak Flow      Pain Score      Pain Loc      Pain Edu?      Excl. in [place]?    No data found.  Updated Vital Signs BP 123/60 (BP Location: Left Arm)   Pulse 87   Temp 98 F (36.7 C) (Oral)   Resp 16   LMP 08/25/2020   SpO2 99%   Visual Acuity Right Eye Distance:   Left Eye Distance:   Bilateral Distance:    Right Eye Near:   Left Eye Near:    Bilateral Near:     Physical Exam Vitals and nursing note reviewed.  Constitutional:      Appearance: She is well-developed and well-nourished.      Comments: No acute distress  HENT:     Head: Normocephalic and atraumatic.     Nose: Nose normal.  Eyes:     Conjunctiva/sclera: Conjunctivae normal.  Cardiovascular:     Rate and Rhythm: Normal rate.  Pulmonary:     Effort: Pulmonary effort is normal. No respiratory distress.  Abdominal:     General: There is no distension.     Comments: Umbilical hernia noted, nonerythematous, nontender to palpation around all 4 quadrants of abdomen  Musculoskeletal:        General: Normal range of motion.     Cervical back: Neck supple.  Skin:    General: Skin is warm and dry.  Neurological:     Mental Status: She is alert and oriented to person, place, and time.  Psychiatric:        Mood and Affect: Mood and affect normal.      UC Treatments / Results  Labs (all labs ordered are listed, but only abnormal results are displayed) Labs Reviewed  POCT URINE PREGNANCY - Abnormal; Notable for the following components:      Result Value   Preg Test, Ur Positive (*)    All other components within normal limits    EKG   Radiology No results found.  Procedures Procedures (including critical care time)  Medications Ordered in UC Medications - No data to display  Initial Impression / Assessment and Plan / UC Course  I have reviewed the triage vital signs and the nursing notes.  Pertinent labs & imaging results that were available during my care of the patient were reviewed by me and considered in my medical decision making (see chart for details).     Pregnancy test positive, follow-up with OB/GYN for further monitoring and management of pregnancy.  Initiate prenatal.  Diclegis for nausea.  Discussed concerns of possible abdominal pain in setting of pregnancy without prior intrauterine confirmation as well as possible correlation to the local hernia.  Recommended to follow-up at women's MAU if pain returning or worsening.  Discussed strict return precautions. Patient verbalized  understanding and is agreeable with plan.  Final Clinical Impressions(s) / UC Diagnoses   Final diagnoses:  Positive pregnancy test  Morning sickness     Discharge Instructions     Diclegis: Two tablets at bedtime  on day 1 and 2; if symptoms persist, take 1 tablet in morning and 2 tablets at bedtime on day 3; if symptoms persist, may increase to 1 tablet in morning, 1 tablet mid-afternoon, and 2 tablets at bedtime on day 4 (maximum: doxylamine 40 mg/pyridoxine 40 mg (4 tablets) per day). OR One-half of the 25 mg Unisom sleep tablet over-the-counter tablet or two chewable 5 mg tablets can be used off-label as an antiemetic. In addition, pyridoxine 25 mg, also available over-the-counter, is taken three or four times per day;This is a reasonable, less expensive substitute for combination tablets.  Follow up with OBGYN  Go to women's MAU for evaluation of abdmoinal pain- entrance C at main [facility]    ED Prescriptions    Medication Sig Dispense Auth. Provider   Doxylamine-Pyridoxine 10-10 MG TBEC Take 2 tablets by mouth at bedtime as needed. 60 tablet Yassin Scales, [place] C, PA-C     PDMP not reviewed this encounter.   Lutisha Knoche, [place] C, PA-C 11/11/20 1027

## 2020-11-11 NOTE — MAU Note (Signed)
Been having belly button pain, getting harder.  Started 4 days ago. Went to [facility] [employer] [facility], ? 'umbilical hernia'.  Was seen earlier, was suggested she come here. +HPT this past week, confirmed today at [facility].

## 2020-11-11 NOTE — ED Triage Notes (Signed)
Pt here for confirmation of pregnancy with home  Test and LMP was 08/25/2020; pt sts some umbilical pain

## 2020-11-14 LAB — GC/CHLAMYDIA PROBE AMP (~~LOC~~) NOT AT ARMC
Chlamydia: NEGATIVE
Comment: NEGATIVE
Comment: NORMAL
Neisseria Gonorrhea: NEGATIVE

## 2020-11-19 NOTE — L&D Delivery Note (Signed)
OB/GYN Faculty Practice Delivery Note  Janet Pollard is a 30 y.o. G2P1001 s/p SVD at [redacted]w[redacted]d She was admitted for SOL.   ROM: 4h 227mith clear fluid GBS Status:  Negative/-- (07/19 0000) Maximum Maternal Temperature: 98.41F  Labor Progress: Initial SVE: 4/80/-2. She then progressed to complete expectantly.   Delivery Date/Time: 8/8 at 0248 Delivery: Called to room and patient was complete and pushing. Head delivered Middle OP. No nuchal cord present. Shoulder and body delivered in usual fashion. Infant with delayed cry, placed on mother's abdomen, dried and stimulated with cry afterwards. Cord clamped x 2 after 1-minute delay, and cut by father. Cord blood drawn. Placenta delivered spontaneously with gentle cord traction. Fundus firm with massage and Pitocin. Labia, perineum, vagina, and cervix inspected inspected with shallow 2nd degree perineal laceration. Repaired with 3.0 vicryl.  Baby Weight: pending  Placenta: Sent to L&D Complications: None Lacerations: 2nd degree, see above  EBL: 100 mL Analgesia: Epidural  Infant:  APGAR (1 MIN): 7   APGAR (5 MINS): 8   APGAR (10 MINS):     SaDarrelyn HillockDO  OB Family Medicine Fellow, [facility]En[facility]landsor [facility] Falls[facility]Gr[facility] 06/26/2021, 3:31 AM

## 2020-12-19 LAB — OB RESULTS CONSOLE ABO/RH: RH Type: POSITIVE

## 2020-12-19 LAB — OB RESULTS CONSOLE HEPATITIS B SURFACE ANTIGEN: Hepatitis B Surface Ag: NEGATIVE

## 2020-12-19 LAB — OB RESULTS CONSOLE HIV ANTIBODY (ROUTINE TESTING): HIV: NONREACTIVE

## 2020-12-19 LAB — OB RESULTS CONSOLE RPR: RPR: NONREACTIVE

## 2020-12-19 LAB — OB RESULTS CONSOLE RUBELLA ANTIBODY, IGM: Rubella: IMMUNE

## 2021-06-06 LAB — OB RESULTS CONSOLE GBS: GBS: NEGATIVE

## 2021-06-06 LAB — OB RESULTS CONSOLE GC/CHLAMYDIA
Chlamydia: NEGATIVE
Gonorrhea: NEGATIVE

## 2021-06-25 ENCOUNTER — Inpatient Hospital Stay (HOSPITAL_COMMUNITY)
Admission: AD | Admit: 2021-06-25 | Discharge: 2021-06-27 | DRG: 807 | Disposition: A | Discharge status: Home / Self Care | Payer: Medicaid Other | Attending: Obstetrics and Gynecology | Admitting: Obstetrics and Gynecology

## 2021-06-25 ENCOUNTER — Encounter (HOSPITAL_COMMUNITY): Payer: Self-pay | Admitting: Obstetrics and Gynecology

## 2021-06-25 ENCOUNTER — Other Ambulatory Visit: Payer: Self-pay

## 2021-06-25 ENCOUNTER — Inpatient Hospital Stay (HOSPITAL_COMMUNITY): Payer: Medicaid Other | Admitting: Anesthesiology

## 2021-06-25 DIAGNOSIS — O26893 Other specified pregnancy related conditions, third trimester: Secondary | ICD-10-CM | POA: Diagnosis present

## 2021-06-25 DIAGNOSIS — Z20822 Contact with and (suspected) exposure to covid-19: Secondary | ICD-10-CM | POA: Diagnosis present

## 2021-06-25 DIAGNOSIS — Z3A39 39 weeks gestation of pregnancy: Secondary | ICD-10-CM

## 2021-06-25 DIAGNOSIS — O9902 Anemia complicating childbirth: Secondary | ICD-10-CM | POA: Diagnosis present

## 2021-06-25 DIAGNOSIS — Z87891 Personal history of nicotine dependence: Secondary | ICD-10-CM | POA: Diagnosis not present

## 2021-06-25 DIAGNOSIS — O09299 Supervision of pregnancy with other poor reproductive or obstetric history, unspecified trimester: Secondary | ICD-10-CM

## 2021-06-25 LAB — COMPREHENSIVE METABOLIC PANEL
ALT: 14 U/L (ref 0–44)
AST: 19 U/L (ref 15–41)
Albumin: 2.6 g/dL — ABNORMAL LOW (ref 3.5–5.0)
Alkaline Phosphatase: 82 U/L (ref 38–126)
Anion gap: 9 (ref 5–15)
BUN: 5 mg/dL — ABNORMAL LOW (ref 6–20)
CO2: 20 mmol/L — ABNORMAL LOW (ref 22–32)
Calcium: 8.9 mg/dL (ref 8.9–10.3)
Chloride: 105 mmol/L (ref 98–111)
Creatinine, Ser: 0.6 mg/dL (ref 0.44–1.00)
GFR, Estimated: >60 mL/min (ref >60)
Glucose, Bld: 94 mg/dL (ref 70–99)
Potassium: 3.3 mmol/L — ABNORMAL LOW (ref 3.5–5.1)
Sodium: 134 mmol/L — ABNORMAL LOW (ref 135–145)
Total Bilirubin: 0.4 mg/dL (ref 0.3–1.2)
Total Protein: 6.5 g/dL (ref 6.5–8.1)

## 2021-06-25 LAB — CBC
HCT: 30.9 % — ABNORMAL LOW (ref 36.0–46.0)
Hemoglobin: 9.6 g/dL — ABNORMAL LOW (ref 12.0–15.0)
MCH: 27.2 pg (ref 26.0–34.0)
MCHC: 31.1 g/dL (ref 30.0–36.0)
MCV: 87.5 fL (ref 80.0–100.0)
Platelets: 273 10*3/uL (ref 150–400)
RBC: 3.53 MIL/uL — ABNORMAL LOW (ref 3.87–5.11)
RDW: 15.2 % (ref 11.5–15.5)
WBC: 9.4 10*3/uL (ref 4.0–10.5)
nRBC: 0 % (ref 0.0–0.2)

## 2021-06-25 LAB — RESP PANEL BY RT-PCR (FLU A&B, COVID) ARPGX2
Influenza A by PCR: NEGATIVE
Influenza B by PCR: NEGATIVE
SARS Coronavirus 2 by RT PCR: NEGATIVE

## 2021-06-25 MED ORDER — EPHEDRINE 5 MG/ML INJ: 10.0000 mg | INTRAVENOUS | Status: DC | PRN

## 2021-06-25 MED ORDER — LACTATED RINGERS IV SOLN
500.0000 mL | INTRAVENOUS | Status: DC | PRN
  Administered 2021-06-25: 500 mL via INTRAVENOUS
  Administered 2021-06-25: 250 mL via INTRAVENOUS

## 2021-06-25 MED ORDER — SOD CITRATE-CITRIC ACID 500-334 MG/5ML PO SOLN: 30.0000 mL | ORAL | Status: DC | PRN

## 2021-06-25 MED ORDER — OXYTOCIN-SODIUM CHLORIDE 30-0.9 UT/500ML-% IV SOLN
2.5000 [IU]/h | INTRAVENOUS | Status: DC
  Filled 2021-06-25: qty 500

## 2021-06-25 MED ORDER — LACTATED RINGERS IV SOLN: INTRAVENOUS | Status: DC

## 2021-06-25 MED ORDER — LIDOCAINE HCL (PF) 1 % IJ SOLN
INTRAMUSCULAR | Status: DC | PRN
  Administered 2021-06-25: 11 mL via EPIDURAL

## 2021-06-25 MED ORDER — DIPHENHYDRAMINE HCL 50 MG/ML IJ SOLN: 12.5000 mg | INTRAMUSCULAR | Status: DC | PRN

## 2021-06-25 MED ORDER — LACTATED RINGERS IV SOLN
500.0000 mL | Freq: Once | INTRAVENOUS | Status: AC
  Administered 2021-06-25: 500 mL via INTRAVENOUS

## 2021-06-25 MED ORDER — PHENYLEPHRINE 40 MCG/ML (10ML) SYRINGE FOR IV PUSH (FOR BLOOD PRESSURE SUPPORT)
80.0000 ug | PREFILLED_SYRINGE | INTRAVENOUS | Status: DC | PRN
  Administered 2021-06-25: 80 ug via INTRAVENOUS

## 2021-06-25 MED ORDER — OXYCODONE-ACETAMINOPHEN 5-325 MG PO TABS: 2.0000 | ORAL_TABLET | ORAL | Status: DC | PRN

## 2021-06-25 MED ORDER — OXYTOCIN BOLUS FROM INFUSION
333.0000 mL | Freq: Once | INTRAVENOUS | Status: AC
  Administered 2021-06-26: 333 mL via INTRAVENOUS

## 2021-06-25 MED ORDER — OXYCODONE-ACETAMINOPHEN 5-325 MG PO TABS: 1.0000 | ORAL_TABLET | ORAL | Status: DC | PRN

## 2021-06-25 MED ORDER — FENTANYL CITRATE (PF) 100 MCG/2ML IJ SOLN
50.0000 ug | Freq: Once | INTRAMUSCULAR | Status: AC
  Administered 2021-06-25: 50 ug via INTRAVENOUS
  Filled 2021-06-25: qty 2

## 2021-06-25 MED ORDER — PHENYLEPHRINE 40 MCG/ML (10ML) SYRINGE FOR IV PUSH (FOR BLOOD PRESSURE SUPPORT)
80.0000 ug | PREFILLED_SYRINGE | INTRAVENOUS | Status: DC | PRN
Start: 2021-06-25 — End: 2021-06-26
  Filled 2021-06-25: qty 10

## 2021-06-25 MED ORDER — ACETAMINOPHEN 325 MG PO TABS: 650.0000 mg | ORAL_TABLET | ORAL | Status: DC | PRN

## 2021-06-25 MED ORDER — FENTANYL-BUPIVACAINE-NACL 0.5-0.125-0.9 MG/250ML-% EP SOLN
12.0000 mL/h | EPIDURAL | Status: DC | PRN
Start: 2021-06-25 — End: 2021-06-26
  Administered 2021-06-25: 12 mL/h via EPIDURAL
  Filled 2021-06-25: qty 250

## 2021-06-25 MED ORDER — ONDANSETRON HCL 4 MG/2ML IJ SOLN: 4.0000 mg | Freq: Four times a day (QID) | INTRAMUSCULAR | Status: DC | PRN

## 2021-06-25 MED ORDER — LIDOCAINE HCL (PF) 1 % IJ SOLN: 30.0000 mL | INTRAMUSCULAR | Status: DC | PRN

## 2021-06-25 NOTE — Anesthesia Procedure Notes (Signed)
Epidural Patient location during procedure: OB Start time: 06/25/2021 5:37 PM End time: 06/25/2021 5:50 PM  Staffing Anesthesiologist: Lynda Rainwater, MD Performed: anesthesiologist   Preanesthetic Checklist Completed: patient identified, IV checked, site marked, risks and benefits discussed, surgical consent, monitors and equipment checked, pre-op evaluation and timeout performed  Epidural Patient position: sitting Prep: ChloraPrep Patient monitoring: heart rate, cardiac monitor, continuous pulse ox and blood pressure Approach: midline Location: L2-L3 Injection technique: LOR saline  Needle:  Needle type: Tuohy  Needle gauge: 17 G Needle length: 9 cm Needle insertion depth: 5 cm Catheter type: closed end flexible Catheter size: 20 Guage Catheter at skin depth: 9 cm Test dose: negative  Assessment Events: blood not aspirated, injection not painful, no injection resistance, no paresthesia and negative IV test  Additional Notes Reason for block:procedure for pain

## 2021-06-25 NOTE — MAU Note (Signed)
Pt reports to mau with c/o ctx q5 min since late last night.  Pt denies LOF or vag bleeding +FM

## 2021-06-25 NOTE — Anesthesia Preprocedure Evaluation (Signed)
Anesthesia Evaluation  Patient identified by MRN, date of birth, ID band Patient awake    Reviewed: Allergy & Precautions, H&P , NPO status , Patient's Chart, lab work & pertinent test results, reviewed documented beta blocker date and time   Airway Mallampati: II  TM Distance: >3 FB Neck ROM: full    Dental no notable dental hx.    Pulmonary neg pulmonary ROS, former smoker,    Pulmonary exam normal breath sounds clear to auscultation       Cardiovascular hypertension, negative cardio ROS Normal cardiovascular exam Rhythm:regular Rate:Normal     Neuro/Psych negative neurological ROS  negative psych ROS   GI/Hepatic negative GI ROS, Neg liver ROS,   Endo/Other  negative endocrine ROS  Renal/GU negative Renal ROS  negative genitourinary   Musculoskeletal   Abdominal   Peds  Hematology negative hematology ROS (+)   Anesthesia Other Findings   Reproductive/Obstetrics (+) Pregnancy                             Anesthesia Physical  Anesthesia Plan  ASA: II  Anesthesia Plan: Epidural   Post-op Pain Management:    Induction:   PONV Risk Score and Plan:   Airway Management Planned:   Additional Equipment:   Intra-op Plan:   Post-operative Plan:   Informed Consent: I have reviewed the patients History and Physical, chart, labs and discussed the procedure including the risks, benefits and alternatives for the proposed anesthesia with the patient or authorized representative who has indicated his/her understanding and acceptance.       Plan Discussed with:   Anesthesia Plan Comments:         Anesthesia Quick Evaluation

## 2021-06-25 NOTE — Progress Notes (Signed)
Labor Progress Note Janet Pollard is a 31 y.o. G2P1001 at 75w3dpresented for labor.  S: Feeling pressure, laying on peanut.   O:  BP 108/66   Pulse 73   Temp 98.1 F (36.7 C) (Oral)   Resp 17   Ht '5\' 1"'$  (1.549 m)   LMP 08/25/2020   SpO2 100%   BMI 32.91 kg/m  EFM: 140/Mod var/+ accel, previously shallow late decels (no further for the past 15 minutes)  CVE: Dilation: Lip/rim Effacement (%): 90 Cervical Position: Middle Station: 0, Plus 1 Presentation: Vertex Exam by:: SWest PughRN   A&P: 30y.o. GBQ:[id number] for labor.  #Labor: Progressing well. Cervix is rim.  #Pain: Epidural  #FWB: Currently Cat 1 strip, previously Cat II with late decels however with position changes have noted improvement.  #GBS negative  #History of pre-e: BP WNL with exception of 156/76 earlier today. Pre-e labs WNL, PCR still pending. No concerning s/sx. Will monitor.   Anticipate she will be complete and pushing soon.   SaPatriciaann ClanDO 11:32 PM

## 2021-06-25 NOTE — Progress Notes (Signed)
Labor Progress Note Janet Pollard is a 30 y.o. G2P1001 at 46w3dpresented for labor S:  Feeling comfortable, epidural makes her contractions less uncomfortable  O:  BP 117/64   Pulse 85   Temp 97.8 F (36.6 C) (Oral)   Resp 19   Ht '5\' 1"'$  (1.549 m)   LMP 08/25/2020   SpO2 100%   BMI 32.91 kg/m  EFM: 135bpm/moderate variabilty/+ accels, recurrent late decels  CVE: Dilation: 6 Effacement (%): 70 Cervical Position: Middle Station: -2 Presentation: Vertex Exam by:: Z.Burton, RN   A&P: 30y.o. G2P1001 332w3dabor #Labor: Progressing well. Expectant managment #Pain: Epidural in place #FWB: Cat 2, will give fluid bolus and reposition #GBS negative   AnWaldon MerlMD 7:57 PM

## 2021-06-25 NOTE — H&P (Signed)
OBSTETRIC ADMISSION HISTORY AND PHYSICAL  Janet Pollard is a 30 y.o. female G2P1001 with IUP at 25w3dby 7wk [place] for early labor. She reports +FMs, No LOF, no VB, no blurry vision, headaches or peripheral edema, and RUQ pain.  She plans on Breast feeding. She wants natural family planning for birth control. [facility]  She endorses a normal prenatal course with normal blood pressures  Dating: By 7 wk [place]--->  Estimated Date of Delivery: 06/29/21  Sono:    '@[redacted]w[redacted]d'$ , CWD, normal anatomy, cephalic presentation, posterior placenta, 734g, 13.1% EFW   Prenatal History/Complications:  -Hx PreE w/ SF/HELLP syndrome, gHTN -liver hemangioma  Past Medical History: Past Medical History:  Diagnosis Date   History of stomach ulcers 2011   Hypertension    Ovarian cyst 2011    Past Surgical History: Past Surgical History:  Procedure Laterality Date   WISDOM TOOTH EXTRACTION      Obstetrical History: OB History     Gravida  2   Para  1   Term  1   Preterm      AB      Living  1      SAB      IAB      Ectopic      Multiple  0   Live Births  1           Social History Social History   Socioeconomic History   Marital status: Single    Spouse name: Not on file   Number of children: Not on file   Years of education: Not on file   Highest education level: Not on file  Occupational History   Not on file  Tobacco Use   Smoking status: Former    Packs/day: 0.25    Types: Cigarettes    Quit date: 06/08/2017    Years since quitting: 4.0   Smokeless tobacco: Never  Vaping Use   Vaping Use: Never used  Substance and Sexual Activity   Alcohol use: No   Drug use: No   Sexual activity: Not Currently    Birth control/protection: None  Other Topics Concern   Not on file  Social History Narrative   Not on file   Social Determinants of Health   Financial Resource Strain: Not on file  Food Insecurity: Not on file   Transportation Needs: Not on file  Physical Activity: Not on file  Stress: Not on file  Social Connections: Not on file    Family History: Family History  Problem Relation Age of Onset   Diabetes Father     Allergies: No Known Allergies  Medications Prior to Admission  Medication Sig Dispense Refill Last Dose   Prenatal MV & Min w/FA-DHA (PRENATAL ADULT GUMMY/DHA/FA) 0.4-25 MG CHEW Chew 2 tablets by mouth daily.   06/24/2021   amLODipine (NORVASC) 5 MG tablet Take 1 tablet (5 mg total) by mouth daily. (Patient not taking: Reported on 11/11/2020) 30 tablet 1    Doxylamine-Pyridoxine 10-10 MG TBEC Take 2 tablets by mouth at bedtime as needed. 60 tablet 0      Review of Systems   All systems reviewed and negative except as stated in HPI  Blood pressure 128/79, pulse 87, temperature 98.2 F (36.8 C), resp. rate 18, height '5\' 1"'$  (1.549 m), last menstrual period 08/25/2020, SpO2 100 %, unknown if currently breastfeeding. General appearance: alert, cooperative, and appears stated age Lungs: clear to auscultation bilaterally  Heart: regular rate and rhythm Abdomen: soft, non-tender; bowel sounds normal Pelvic: 4/80/-2 per RN Extremities: Homans sign is negative, no sign of DVT Presentation: cephalic Fetal monitoringBaseline: 130 bpm, Variability: Good {> 6 bpm), Accelerations: Reactive, and Decelerations: Absent Uterine activity: q4-6 min Dilation: 4 Effacement (%): 80 Station: -2 Exam by:: Almond Lint, RN   Prenatal labs: ABO, Rh: --/--/B POS (08/07 1556)B Positive Antibody: NEG (08/07 1556)Negative Rubella:  Immune RPR:   Non reactive HBsAg:   Non reactive HIV:   Non reactive GBS:   Negative 1 hr Glucola :105 Genetic screening : Low Risk Anatomy US: Normal  Prenatal Transfer Tool  Maternal Diabetes: No Genetic Screening: Normal Maternal Ultrasounds/Referrals: Normal Fetal Ultrasounds or other Referrals:  None Maternal Substance Abuse:  No Significant Maternal  Medications:  None Significant Maternal Lab Results: Group B Strep negative  Results for orders placed or performed during the hospital encounter of 06/25/21 (from the past 24 hour(s))  CBC   Collection Time: 06/25/21  3:56 PM  Result Value Ref Range   WBC 9.4 4.0 - 10.5 K/uL   RBC 3.53 (L) 3.87 - 5.11 MIL/uL   Hemoglobin 9.6 (L) 12.0 - 15.0 g/dL   HCT 30.9 (L) 36.0 - 46.0 %   MCV 87.5 80.0 - 100.0 fL   MCH 27.2 26.0 - 34.0 pg   MCHC 31.1 30.0 - 36.0 g/dL   RDW 15.2 11.5 - 15.5 %   Platelets 273 150 - 400 K/uL   nRBC 0.0 0.0 - 0.2 %  Type and screen [place]   Collection Time: 06/25/21  3:56 PM  Result Value Ref Range   ABO/RH(D) B POS    Antibody Screen NEG    Sample Expiration      06/28/2021,2359 Performed at [facility], [address] [place], [place] [address]     Patient Active Problem List   Diagnosis Date Noted   Labor without complication [id number]   Hypertension in pregnancy, preeclampsia, severe, delivered/postpartum 01/17/2018   Cholestasis during pregnancy in third trimester 01/09/2018   Uterine fibroid complicating antenatal care, baby not yet delivered in second trimester 09/27/2017   Pelvic pain affecting pregnancy in second trimester, antepartum 09/27/2017    Assessment/Plan:  Janet Pollard is a 30 y.o. G2P1001 at 45w3dhere for early Labor  #Labor: Expectant management #Pain: PRN #FWB: Cat 1 #ID:  GBS negative #MOF: Breast #MOC:Natural Family planning #Circ:  NA, female #Hx PreE: collect labs, monitor Bps #Anemia: Hgb on admission 9.6  AWaldon Merl MD  06/25/2021, 5:48 PM

## 2021-06-26 ENCOUNTER — Encounter (HOSPITAL_COMMUNITY): Payer: Self-pay | Admitting: Obstetrics and Gynecology

## 2021-06-26 DIAGNOSIS — Z3A39 39 weeks gestation of pregnancy: Secondary | ICD-10-CM

## 2021-06-26 LAB — TYPE AND SCREEN
ABO/RH(D): B POS
Antibody Screen: NEGATIVE

## 2021-06-26 LAB — PROTEIN / CREATININE RATIO, URINE
Creatinine, Urine: 37.33 mg/dL
Protein Creatinine Ratio: 0.24 mg/mg{Cre} — ABNORMAL HIGH (ref 0.00–0.15)
Total Protein, Urine: 9 mg/dL

## 2021-06-26 LAB — RPR: RPR Ser Ql: NONREACTIVE

## 2021-06-26 MED ORDER — MEASLES, MUMPS & RUBELLA VAC IJ SOLR: 0.5000 mL | Freq: Once | INTRAMUSCULAR | Status: DC

## 2021-06-26 MED ORDER — SENNOSIDES-DOCUSATE SODIUM 8.6-50 MG PO TABS
2.0000 | ORAL_TABLET | ORAL | Status: DC
  Administered 2021-06-26 – 2021-06-27 (×2): 2 via ORAL
  Filled 2021-06-26 (×2): qty 2

## 2021-06-26 MED ORDER — ONDANSETRON HCL 4 MG/2ML IJ SOLN: 4.0000 mg | INTRAMUSCULAR | Status: DC | PRN

## 2021-06-26 MED ORDER — PRENATAL MULTIVITAMIN CH
1.0000 | ORAL_TABLET | Freq: Every day | ORAL | Status: DC
  Administered 2021-06-26: 1 via ORAL
  Filled 2021-06-26: qty 1

## 2021-06-26 MED ORDER — COCONUT OIL OIL: 1.0000 "application " | TOPICAL_OIL | Status: DC | PRN

## 2021-06-26 MED ORDER — IBUPROFEN 600 MG PO TABS
600.0000 mg | ORAL_TABLET | Freq: Four times a day (QID) | ORAL | Status: DC
  Administered 2021-06-26 – 2021-06-27 (×6): 600 mg via ORAL
  Filled 2021-06-26 (×6): qty 1

## 2021-06-26 MED ORDER — WITCH HAZEL-GLYCERIN EX PADS: 1.0000 "application " | MEDICATED_PAD | CUTANEOUS | Status: DC | PRN

## 2021-06-26 MED ORDER — DIBUCAINE (PERIANAL) 1 % EX OINT: 1.0000 "application " | TOPICAL_OINTMENT | CUTANEOUS | Status: DC | PRN

## 2021-06-26 MED ORDER — ONDANSETRON HCL 4 MG PO TABS: 4.0000 mg | ORAL_TABLET | ORAL | Status: DC | PRN

## 2021-06-26 MED ORDER — ACETAMINOPHEN 325 MG PO TABS: 650.0000 mg | ORAL_TABLET | ORAL | Status: DC | PRN

## 2021-06-26 MED ORDER — ZOLPIDEM TARTRATE 5 MG PO TABS: 5.0000 mg | ORAL_TABLET | Freq: Every evening | ORAL | Status: DC | PRN

## 2021-06-26 MED ORDER — TETANUS-DIPHTH-ACELL PERTUSSIS 5-2.5-18.5 LF-MCG/0.5 IM SUSY: 0.5000 mL | PREFILLED_SYRINGE | Freq: Once | INTRAMUSCULAR | Status: DC

## 2021-06-26 MED ORDER — SODIUM CHLORIDE 0.9% FLUSH: 3.0000 mL | Freq: Two times a day (BID) | INTRAVENOUS | Status: DC

## 2021-06-26 MED ORDER — BENZOCAINE-MENTHOL 20-0.5 % EX AERO
1.0000 "application " | INHALATION_SPRAY | CUTANEOUS | Status: DC | PRN
  Administered 2021-06-26: 1 via TOPICAL
  Filled 2021-06-26: qty 56

## 2021-06-26 MED ORDER — SIMETHICONE 80 MG PO CHEW: 80.0000 mg | CHEWABLE_TABLET | ORAL | Status: DC | PRN

## 2021-06-26 MED ORDER — SODIUM CHLORIDE 0.9 % IV SOLN: 250.0000 mL | INTRAVENOUS | Status: DC | PRN

## 2021-06-26 MED ORDER — DIPHENHYDRAMINE HCL 25 MG PO CAPS: 25.0000 mg | ORAL_CAPSULE | Freq: Four times a day (QID) | ORAL | Status: DC | PRN

## 2021-06-26 MED ORDER — SODIUM CHLORIDE 0.9% FLUSH: 3.0000 mL | INTRAVENOUS | Status: DC | PRN

## 2021-06-26 NOTE — Discharge Summary (Signed)
Postpartum Discharge Summary  Date of Service updated     Patient Name: Janet Pollard DOB: 1991-10-17 MRN: [id number]  Date of admission: 06/25/2021 Delivery date:06/26/2021  Delivering provider: Patriciaann Clan  Date of discharge: 06/27/2021  Admitting diagnosis: Labor without complication [W80] Intrauterine pregnancy: [redacted]w[redacted]d    Secondary diagnosis:  Active Problems:   Labor without complication   History of pre-eclampsia in prior pregnancy, currently pregnant  Additional problems:     Discharge diagnosis: Term Pregnancy Delivered                                              Post partum procedures: None  Augmentation: N/A Complications: None  Hospital course: Onset of Labor With Vaginal Delivery      30y.o. yo [id number] 365w4das admitted in Latent Labor on 06/25/2021. Patient had an uncomplicated labor course as follows:  Membrane Rupture Time/Date: 10:28 PM ,06/25/2021   Delivery Method:Vaginal, Spontaneous  Episiotomy: None  Lacerations:  2nd degree  Patient had an uncomplicated postpartum course.  She is ambulating, tolerating a regular diet, passing flatus, and urinating well. Asymptomatic anemia, re-started on every other day ferrous sulfate. Patient is discharged home in stable condition on 06/27/21.  Newborn Data: Birth date:06/26/2021  Birth time:2:48 AM  Gender:Female  Living status:Living  Apgars:7 ,8  Weight:3096 g   Magnesium Sulfate received: No BMZ received: No Rhophylac:No MMR:No T-DaP:Given prenatally Flu: N/A Transfusion:No  Physical exam  Vitals:   06/26/21 1030 06/26/21 1430 06/26/21 2034 06/27/21 0519  BP: 129/77 110/71 131/74 115/74  Pulse: 90 81 73 75  Resp: 17 18 18 18   Temp: 98 F (36.7 C) 97.9 F (36.6 C) 97.6 F (36.4 C) 97.8 F (36.6 C)  TempSrc: Oral Oral Oral Oral  SpO2:      Height:       General: alert, cooperative, and no distress Lochia: appropriate Uterine Fundus: firm Incision: N/A DVT Evaluation: No significant  calf/ankle edema. Labs: Lab Results  Component Value Date   WBC 9.4 06/25/2021   HGB 9.6 (L) 06/25/2021   HCT 30.9 (L) 06/25/2021   MCV 87.5 06/25/2021   PLT 273 06/25/2021   CMP Latest Ref Rng & Units 06/25/2021  Glucose 70 - 99 mg/dL 94  BUN 6 - 20 mg/dL 5(L)  Creatinine 0.44 - 1.00 mg/dL 0.60  Sodium 135 - 145 mmol/L 134(L)  Potassium 3.5 - 5.1 mmol/L 3.3(L)  Chloride 98 - 111 mmol/L 105  CO2 22 - 32 mmol/L 20(L)  Calcium 8.9 - 10.3 mg/dL 8.9  Total Protein 6.5 - 8.1 g/dL 6.5  Total Bilirubin 0.3 - 1.2 mg/dL 0.4  Alkaline Phos 38 - 126 U/L 82  AST 15 - 41 U/L 19  ALT 0 - 44 U/L 14   Edinburgh Score: Edinburgh Postnatal Depression Scale Screening Tool 06/26/2021  I have been able to laugh and see the funny side of things. 0  I have looked forward with enjoyment to things. 0  I have blamed myself unnecessarily when things went wrong. 0  I have been anxious or worried for no good reason. 0  I have felt scared or panicky for no good reason. 0  Things have been getting on top of me. 0  I have been so unhappy that I have had difficulty sleeping. 0  I have felt sad or miserable. 0  I have been so unhappy that I have been crying. 0  The thought of harming myself has occurred to me. 0  Edinburgh Postnatal Depression Scale Total 0     After visit meds:  Allergies as of 06/27/2021   No Known Allergies      Medication List     STOP taking these medications    amLODipine 5 MG tablet Commonly known as: NORVASC   Doxylamine-Pyridoxine 10-10 MG Tbec       TAKE these medications    acetaminophen 325 MG tablet Commonly known as: Tylenol Take 2 tablets (650 mg total) by mouth every 4 (four) hours as needed (for pain scale < 4).   ibuprofen 600 MG tablet Commonly known as: ADVIL Take 1 tablet (600 mg total) by mouth every 6 (six) hours.   Prenatal Adult Gummy/DHA/FA 0.4-25 MG Chew Chew 2 tablets by mouth daily.         Discharge home in stable condition Infant  Feeding: Breast Infant Disposition:home with mother Discharge instruction: per After Visit Summary and Postpartum booklet. Activity: Advance as tolerated. Pelvic rest for 6 weeks.  Diet: routine diet Future Appointments:No future appointments. Follow up Visit:  Follow-up Information     Department, [facility]. Call.   Why: to schedule postpartum visit in 4-6 weeks Contact information: [address] [place] [address] [phone]                 Patient received prenatal care at [facility].  Delivery mode:  Vaginal, Spontaneous  Anticipated Birth Control:   NFP   06/27/2021 Patriciaann Clan, DO

## 2021-06-26 NOTE — Anesthesia Postprocedure Evaluation (Signed)
Anesthesia Post Note  Patient: Janet Pollard  Procedure(s) Performed: AN AD HOC LABOR EPIDURAL     Patient location during evaluation: Mother Baby Anesthesia Type: Epidural Level of consciousness: awake and alert Pain management: pain level controlled Vital Signs Assessment: post-procedure vital signs reviewed and stable Respiratory status: spontaneous breathing Cardiovascular status: stable Postop Assessment: no headache, adequate PO intake, no backache, patient able to bend at knees, able to ambulate, epidural receding and no apparent nausea or vomiting Anesthetic complications: no   No notable events documented.  Last Vitals:  Vitals:   06/26/21 0630 06/26/21 1030  BP: 133/71 129/77  Pulse: 77 90  Resp: 18 17  Temp: 36.8 C 36.7 C  SpO2: 100%     Last Pain:  Vitals:   06/26/21 1030  TempSrc: Oral  PainSc: 0-No pain   Pain Goal:                Epidural/Spinal Function Cutaneous sensation: Normal sensation (06/26/21 1030), Patient able to flex knees: Yes (06/26/21 1030), Patient able to lift hips off bed: Yes (06/26/21 1030), Back pain beyond tenderness at insertion site: No (06/26/21 1030), Progressively worsening motor and/or sensory loss: No (06/26/21 1030), Bowel and/or bladder incontinence post epidural: No (06/26/21 1030)  Via [place], [place]

## 2021-06-26 NOTE — Lactation Note (Signed)
This note was copied from a baby's chart. Lactation Consultation Note Baby not hr old yet.  Mom holding baby STS stated she was just sucking on her fingers. Attempted to latch in cradle position, baby wouldn't open her mouth. No rooting noted. LC hand expressed colostrum easily. Mom stated she has been leaking. Praised mom.  LC t-cupped nipple in baby's mouth. If LC let go baby let go. Baby would suckle once in a while. Baby sleepy. Encouraged mom to hold STS and try again .  Mentioned to mom LC would bring shells and pump to rm. To bring nipple out more. Mom stated OK. Will f/u on MBU.  Patient Name: Janet Pollard [id number] Date: 06/26/2021 Reason for consult: L&D Initial assessment;Term Age:4 hours  Maternal Data Has patient been taught Hand Expression?: Yes  Feeding    LATCH Score Latch: Too sleepy or reluctant, no latch achieved, no sucking elicited.  Audible Swallowing: None  Type of Nipple: Flat  Comfort (Breast/Nipple): Soft / non-tender  Hold (Positioning): Full assist, staff holds infant at breast  LATCH Score: 3   Lactation Tools Discussed/Used    Interventions Interventions: Breast feeding basics reviewed;Adjust position;Assisted with latch;Support pillows;Skin to skin;Breast massage;Hand express;Breast compression  Discharge    Consult Status Consult Status: Follow-up Date: 06/26/21 Follow-up type: In-patient    Theodoro Kalata 06/26/2021, 3:58 AM

## 2021-06-26 NOTE — Lactation Note (Addendum)
This note was copied from a baby's chart. Lactation Consultation Note  Patient Name: Janet Pollard [id number] Date: 06/26/2021 Reason for consult: Follow-up assessment;Mother's request;Difficult latch;Term Age:30 hours  LC assisted with mother latching in football and cross cradle with audible swallows. LC did some suck training to bring infant tongue down before latching.    Mom able to latch infant without use of 24 NS.   Plan 1. To feed based on cues 8-12x in 24hr period no more than 3-4 hrs without an attempt. Mom to latch infant STS and listen for audible swallows keeping infant active at breast with compression.  2. Mom to pre pump with manual to bring out nipples before latching.  3. I and O sheet reviewed.  All questions answered at the end of the visit.   Maternal Data Has patient been taught Hand Expression?: Yes Does the patient have breastfeeding experience prior to this delivery?: Yes How long did the patient breastfeed?: 72month EBM and pumped and bottle fed for 1 year  Feeding Mother's Current Feeding Choice: Breast Milk  LATCH Score Latch: Repeated attempts needed to sustain latch, nipple held in mouth throughout feeding, stimulation needed to elicit sucking reflex.  Audible Swallowing: Spontaneous and intermittent  Type of Nipple: Flat (but will evert with stimulation)  Comfort (Breast/Nipple): Soft / non-tender  Hold (Positioning): Assistance needed to correctly position infant at breast and maintain latch.  LATCH Score: 7   Lactation Tools Discussed/Used Tools: Pump;Flanges (Mom sized last night, using pump to pre pump before latching to bring out her nipples.) Flange Size: 24 Breast pump type: Manual Pump Education: Setup, frequency, and cleaning;Milk Storage Reason for Pumping: elongate her nipples Pumping frequency: pre pump 5-10 min before latching  Interventions Interventions: Breast feeding basics reviewed;Assisted with latch;Adjust  position;Skin to skin;Support pillows;Hand pump;Breast compression;Breast massage;Position options;Hand express;Expressed milk;Education;Pre-pump if needed  Discharge Pump: Manual WIC Program: Yes  Consult Status Consult Status: Follow-up Date: 06/27/21 Follow-up type: In-patient    Jermond Burkemper  Nicholson-Springer 06/26/2021, 6:23 PM

## 2021-06-27 DIAGNOSIS — O09299 Supervision of pregnancy with other poor reproductive or obstetric history, unspecified trimester: Secondary | ICD-10-CM

## 2021-06-27 MED ORDER — ACETAMINOPHEN 325 MG PO TABS: 650.0000 mg | ORAL_TABLET | ORAL | Status: AC | PRN

## 2021-06-27 MED ORDER — IBUPROFEN 600 MG PO TABS: 600.0000 mg | ORAL_TABLET | Freq: Four times a day (QID) | ORAL | 0 refills | Status: AC

## 2021-06-27 NOTE — Lactation Note (Signed)
This note was copied from a baby's chart. Lactation Consultation Note  Patient Name: Janet Pollard [id number] Date: 06/27/2021 Reason for consult: Follow-up assessment Age:30 hours   P2 mother whose infant is now 36 hours old.  This is a term baby at 39+4 weeks.  Baby has been discharged.  Mother had no questions/concerns related to breast feeding.  Last LATCH score was a 7; voiding/stooling well.  Engorgement prevention/treatment reviewed.  Mother has been given a manual pump.  Mother has our OP phone number for any further questions.  Father present.     Maternal Data    Feeding    LATCH Score                    Lactation Tools Discussed/Used    Interventions    Discharge Discharge Education: Engorgement and breast care  Consult Status Consult Status: Complete Date: 06/27/21 Follow-up type: Call as needed    Mael Delap R Ruth Tully 06/27/2021, 10:49 AM

## 2021-06-27 NOTE — Progress Notes (Signed)
Post Partum Day 1 Subjective: no complaints, up ad lib, voiding, tolerating PO, and + flatus. Pain well-controlled on ibuprofen.   Objective: Blood pressure 115/74, pulse 75, temperature 97.8 F (36.6 C), temperature source Oral, resp. rate 18, height '5\' 1"'$  (1.549 m), last menstrual period 08/25/2020, SpO2 100 %, unknown if currently breastfeeding.  Physical Exam:  General: alert, cooperative, and no distress Lochia: appropriate Uterine Fundus: firm DVT Evaluation: No evidence of DVT seen on physical exam. No significant calf/ankle edema.  Recent Labs    06/25/21 1556  HGB 9.6*  HCT 30.9*    Assessment/Plan: Discharge home  History of pre-eclampsia in prior pregnancy: - BP within normal limits, Pcr 0.24.  Anemia: - PO iron   LOS: 2 days   Nelva Nay, Medical Student 06/27/2021, 7:42 AM

## 2021-07-07 ENCOUNTER — Telehealth (HOSPITAL_COMMUNITY): Payer: Self-pay | Admitting: *Deleted

## 2021-07-07 NOTE — Telephone Encounter (Signed)
Mom reports feeling good. No concerns about herself, except legs still swollen after standing. Encouraged to call OB office if swelling gets worse. Mom agrees. EPDS = 0 (hospital score=0). Mom reports baby is well. Gaining weight. No concerns about baby.  Odis Hollingshead, RN 07-07-2021 at 3:39pm
